# Patient Record
Sex: Female | Born: 1975
Health system: Southern US, Community
[De-identification: ages and names within clinical notes are randomized; demographics above are authoritative.]

## PROBLEM LIST (undated history)

## (undated) DIAGNOSIS — I10 Essential (primary) hypertension: Secondary | ICD-10-CM

## (undated) DIAGNOSIS — M1990 Unspecified osteoarthritis, unspecified site: Secondary | ICD-10-CM

## (undated) DIAGNOSIS — B20 Human immunodeficiency virus [HIV] disease: Secondary | ICD-10-CM

## (undated) HISTORY — PX: ECTOPIC PREGNANCY SURGERY: SHX613

## (undated) HISTORY — PX: OTHER SURGICAL HISTORY: SHX169

## (undated) MED FILL — Abacavir-Dolutegravir-Lamivudine Tab 600-50-300 MG: ORAL | Fill #0 | Status: CN

---

## 1999-12-27 HISTORY — PX: ECTOPIC PREGNANCY SURGERY: SHX613

## 2001-12-26 DIAGNOSIS — K429 Umbilical hernia without obstruction or gangrene: Secondary | ICD-10-CM

## 2001-12-26 DIAGNOSIS — I10 Essential (primary) hypertension: Secondary | ICD-10-CM

## 2001-12-26 DIAGNOSIS — B20 Human immunodeficiency virus [HIV] disease: Secondary | ICD-10-CM

## 2001-12-26 DIAGNOSIS — Z21 Asymptomatic human immunodeficiency virus [HIV] infection status: Secondary | ICD-10-CM

## 2001-12-26 HISTORY — DX: Asymptomatic human immunodeficiency virus (hiv) infection status: Z21

## 2001-12-26 HISTORY — DX: Human immunodeficiency virus (HIV) disease: B20

## 2001-12-26 HISTORY — DX: Umbilical hernia without obstruction or gangrene: K42.9

## 2001-12-26 HISTORY — PX: OTHER SURGICAL HISTORY: SHX169

## 2001-12-26 HISTORY — DX: Essential (primary) hypertension: I10

## 2012-09-19 DIAGNOSIS — E559 Vitamin D deficiency, unspecified: Secondary | ICD-10-CM | POA: Insufficient documentation

## 2012-09-19 DIAGNOSIS — I10 Essential (primary) hypertension: Secondary | ICD-10-CM | POA: Insufficient documentation

## 2013-07-15 ENCOUNTER — Emergency Department (HOSPITAL_COMMUNITY)
Admission: EM | Admit: 2013-07-15 | Discharge: 2013-07-15 | Disposition: A | Discharge status: Home / Self Care | Payer: 59 | Attending: Emergency Medicine | Admitting: Emergency Medicine

## 2013-07-15 ENCOUNTER — Emergency Department (HOSPITAL_COMMUNITY): Payer: 59

## 2013-07-15 ENCOUNTER — Encounter (HOSPITAL_COMMUNITY): Payer: Self-pay | Admitting: Emergency Medicine

## 2013-07-15 DIAGNOSIS — Z79899 Other long term (current) drug therapy: Secondary | ICD-10-CM | POA: Insufficient documentation

## 2013-07-15 DIAGNOSIS — I498 Other specified cardiac arrhythmias: Secondary | ICD-10-CM | POA: Insufficient documentation

## 2013-07-15 DIAGNOSIS — I471 Supraventricular tachycardia: Secondary | ICD-10-CM

## 2013-07-15 HISTORY — DX: Essential (primary) hypertension: I10

## 2013-07-15 LAB — COMPREHENSIVE METABOLIC PANEL
Albumin: 3.8 g/dL (ref 3.5–5.2)
Alkaline Phosphatase: 64 U/L (ref 39–117)
BUN: 10 mg/dL (ref 6–23)
Creatinine, Ser: 0.84 mg/dL (ref 0.50–1.10)
GFR calc Af Amer: >90 mL/min (ref >90)
Glucose, Bld: 71 mg/dL (ref 70–99)
Total Protein: 7.2 g/dL (ref 6.0–8.3)

## 2013-07-15 LAB — CBC WITH DIFFERENTIAL/PLATELET
Basophils Relative: 0 % (ref 0–1)
Eosinophils Absolute: 0 10*3/uL (ref 0.0–0.7)
Eosinophils Relative: 0 % (ref 0–5)
HCT: 44.6 % (ref 36.0–46.0)
Hemoglobin: 15.4 g/dL — ABNORMAL HIGH (ref 12.0–15.0)
Lymphs Abs: 3.6 10*3/uL (ref 0.7–4.0)
MCH: 32.4 pg (ref 26.0–34.0)
MCHC: 34.5 g/dL (ref 30.0–36.0)
MCV: 93.7 fL (ref 78.0–100.0)
Monocytes Absolute: 0.4 10*3/uL (ref 0.1–1.0)
Monocytes Relative: 5 % (ref 3–12)
Neutrophils Relative %: 46 % (ref 43–77)
RBC: 4.76 MIL/uL (ref 3.87–5.11)

## 2013-07-15 LAB — T4: T4, Total: 7.5 ug/dL (ref 5.0–12.5)

## 2013-07-15 LAB — TSH: TSH: 0.672 u[IU]/mL (ref 0.350–4.500)

## 2013-07-15 MED ORDER — ADENOSINE 6 MG/2ML IV SOLN
INTRAVENOUS | Status: AC
  Administered 2013-07-15: 6 mg
  Filled 2013-07-15: qty 4

## 2013-07-15 MED ORDER — SODIUM CHLORIDE 0.9 % IV BOLUS (SEPSIS)
1000.0000 mL | Freq: Once | INTRAVENOUS | Status: AC
  Administered 2013-07-15: 1000 mL via INTRAVENOUS

## 2013-07-15 MED ORDER — SODIUM CHLORIDE 0.9 % IV SOLN: INTRAVENOUS | Status: DC

## 2013-07-15 NOTE — ED Provider Notes (Signed)
History    CSN: [id number] Arrival date & time 07/15/13  1005  First MD Initiated Contact with Patient 07/15/13 1006     No chief complaint on file.  (Consider location/radiation/quality/duration/timing/severity/associated sxs/prior Treatment) The history is provided by the patient.   patient here complaining of sudden onset of palpitations about 4 hours prior to arrival that started after she ran up a hill. Denies any chest pain chest pressure. Was not short of breath. No recent leg pain or swelling. Has had a history of tachycardia in the past has never been evaluated. Does have a history of high blood pressure and notes compliance with her medications. She takes lisinopril, clonidine, diltiazem. No recent illnesses noted. Was at work and an EKG was done and patient was found to be in SVT with a rate of 160 and transported here. No treatment used prior to arrival No past medical history on file. No past surgical history on file. No family history on file. History  Substance Use Topics  . Smoking status: Not on file  . Smokeless tobacco: Not on file  . Alcohol Use: Not on file   OB History   No data available     Review of Systems  All other systems reviewed and are negative.    Allergies  Review of patient's allergies indicates not on file.  Home Medications  No current outpatient prescriptions on file. There were no vitals taken for this visit. Physical Exam  Nursing note and vitals reviewed. Constitutional: She is oriented to person, place, and time. She appears well-developed and well-nourished.  Non-toxic appearance. No distress.  HENT:  Head: Normocephalic and atraumatic.  Eyes: Conjunctivae, EOM and lids are normal. Pupils are equal, round, and reactive to light.  Neck: Normal range of motion. Neck supple. No tracheal deviation present. No mass present.  Cardiovascular: Regular rhythm and normal heart sounds.  Tachycardia present.  Exam reveals no gallop.   No  murmur heard. Pulmonary/Chest: Effort normal and breath sounds normal. No stridor. No respiratory distress. She has no decreased breath sounds. She has no wheezes. She has no rhonchi. She has no rales.  Abdominal: Soft. Normal appearance and bowel sounds are normal. She exhibits no distension. There is no tenderness. There is no rebound and no CVA tenderness.  Musculoskeletal: Normal range of motion. She exhibits no edema and no tenderness.  Neurological: She is alert and oriented to person, place, and time. She has normal strength. No cranial nerve deficit or sensory deficit. GCS eye subscore is 4. GCS verbal subscore is 5. GCS motor subscore is 6.  Skin: Skin is warm and dry. No abrasion and no rash noted.  Psychiatric: She has a normal mood and affect. Her speech is normal and behavior is normal.    ED Course  Procedures (including critical care time) Labs Reviewed  CBC WITH DIFFERENTIAL  COMPREHENSIVE METABOLIC PANEL  T4  TSH   No results found. No diagnosis found.  MDM   Date: 07/15/2013  Rate: 162  Rhythm: supraventricular tachycardia (SVT)  QRS Axis: normal  Intervals: normal  ST/T Wave abnormalities: nonspecific ST changes  Conduction Disutrbances:none  Narrative Interpretation:   Old EKG Reviewed: none available  11:36 AM Patient given 6 mg of adenosine for her heart rate of 170 without defect. A second dose of 12 mg was given and heart rate slightly decreased. She was given IV fluids as well. Given 5 mg of Cardizem IV and heart rate decreased gradually down to 105. She has  been a symptomatically this her blood pressures remained stable. She has been rechecked multiple times remains stable. She'll be discharged home and follow with her Dr. Thyroid function studies have been performed and patient was informed to have her Dr. followup on this.   CRITICAL CARE Performed by: Toy Baker Total critical care time: 50 Critical care time was exclusive of separately billable  procedures and treating other patients. Critical care was necessary to treat or prevent imminent or life-threatening deterioration. Critical care was time spent personally by me on the following activities: development of treatment plan with patient and/or surrogate as well as nursing, discussions with consultants, evaluation of patient's response to treatment, examination of patient, obtaining history from patient or surrogate, ordering and performing treatments and interventions, ordering and review of laboratory studies, ordering and review of radiographic studies, pulse oximetry and re-evaluation of patient's condition.'   Toy Baker, MD 07/15/13 1137

## 2013-07-15 NOTE — ED Notes (Addendum)
Pt came in with SVT, hr 160s. MD informed. 6mg  adenosine given with no response.  12mg  adenosine given, no immediate response but HR started to gradually decrease from 160s down to 130s. 5mg  cardizem given at 1021

## 2013-07-16 ENCOUNTER — Telehealth (HOSPITAL_COMMUNITY): Payer: Self-pay | Admitting: Emergency Medicine

## 2013-07-16 NOTE — ED Notes (Signed)
Pt calling for TSH results.  Pt informed Results WNL.

## 2015-07-30 DIAGNOSIS — B20 Human immunodeficiency virus [HIV] disease: Secondary | ICD-10-CM | POA: Insufficient documentation

## 2015-08-13 DIAGNOSIS — E669 Obesity, unspecified: Secondary | ICD-10-CM | POA: Insufficient documentation

## 2016-01-15 MED FILL — ATRIPLA 600-200-300 MG TABS: 600-200-300 | 90 days supply | Qty: 90 | Fill #2

## 2016-01-20 MED FILL — CARTIA XT 240 MG CAPSULE SA: 240 | 30 days supply | Qty: 30 | Fill #2

## 2016-02-11 MED FILL — CARTIA XT 240 MG CAPSULE SA: 240 | 30 days supply | Qty: 30 | Fill #3

## 2016-02-15 DIAGNOSIS — E559 Vitamin D deficiency, unspecified: Secondary | ICD-10-CM | POA: Diagnosis not present

## 2016-02-15 DIAGNOSIS — R0789 Other chest pain: Secondary | ICD-10-CM | POA: Diagnosis not present

## 2016-02-15 DIAGNOSIS — I10 Essential (primary) hypertension: Secondary | ICD-10-CM | POA: Diagnosis not present

## 2016-02-15 DIAGNOSIS — Z21 Asymptomatic human immunodeficiency virus [HIV] infection status: Secondary | ICD-10-CM | POA: Diagnosis not present

## 2016-02-15 DIAGNOSIS — H938X2 Other specified disorders of left ear: Secondary | ICD-10-CM | POA: Diagnosis not present

## 2016-02-15 MED FILL — NEO/POLYMYXIN/HC EAR SOLN: 3.5-10000-1 | 22 days supply | Qty: 10 | Fill #0

## 2016-03-07 DIAGNOSIS — I10 Essential (primary) hypertension: Secondary | ICD-10-CM | POA: Diagnosis not present

## 2016-03-07 DIAGNOSIS — M549 Dorsalgia, unspecified: Secondary | ICD-10-CM | POA: Diagnosis not present

## 2016-03-07 DIAGNOSIS — M791 Myalgia: Secondary | ICD-10-CM | POA: Diagnosis not present

## 2016-03-07 DIAGNOSIS — R55 Syncope and collapse: Secondary | ICD-10-CM | POA: Diagnosis not present

## 2016-03-07 DIAGNOSIS — B20 Human immunodeficiency virus [HIV] disease: Secondary | ICD-10-CM | POA: Diagnosis not present

## 2016-03-07 DIAGNOSIS — R5383 Other fatigue: Secondary | ICD-10-CM | POA: Diagnosis not present

## 2016-03-07 DIAGNOSIS — R079 Chest pain, unspecified: Secondary | ICD-10-CM | POA: Diagnosis not present

## 2016-03-07 DIAGNOSIS — G8929 Other chronic pain: Secondary | ICD-10-CM | POA: Diagnosis not present

## 2016-03-08 DIAGNOSIS — R079 Chest pain, unspecified: Secondary | ICD-10-CM | POA: Diagnosis not present

## 2016-03-08 DIAGNOSIS — B20 Human immunodeficiency virus [HIV] disease: Secondary | ICD-10-CM | POA: Diagnosis not present

## 2016-03-08 DIAGNOSIS — R5383 Other fatigue: Secondary | ICD-10-CM | POA: Diagnosis not present

## 2016-03-08 DIAGNOSIS — M549 Dorsalgia, unspecified: Secondary | ICD-10-CM | POA: Diagnosis not present

## 2016-03-08 DIAGNOSIS — G8929 Other chronic pain: Secondary | ICD-10-CM | POA: Diagnosis not present

## 2016-03-08 DIAGNOSIS — R55 Syncope and collapse: Secondary | ICD-10-CM | POA: Diagnosis not present

## 2016-03-08 DIAGNOSIS — M791 Myalgia: Secondary | ICD-10-CM | POA: Diagnosis not present

## 2016-03-08 DIAGNOSIS — I10 Essential (primary) hypertension: Secondary | ICD-10-CM | POA: Diagnosis not present

## 2016-03-25 MED FILL — LISINOPRIL-HCTZ 10-12.5 MG: 10-12.5 | 90 days supply | Qty: 90 | Fill #1

## 2016-03-25 MED FILL — CARTIA XT 240 MG CAPSULE: 240 | 30 days supply | Qty: 30 | Fill #4

## 2016-04-29 MED FILL — CARTIA XT 240 MG CAPSULE: 240 | 30 days supply | Qty: 30 | Fill #5

## 2016-04-29 MED FILL — cloNIDine HCL 0.1 MG TABS: 0.1 | 90 days supply | Qty: 90 | Fill #1

## 2016-05-03 DIAGNOSIS — I10 Essential (primary) hypertension: Secondary | ICD-10-CM | POA: Diagnosis not present

## 2016-05-03 DIAGNOSIS — B20 Human immunodeficiency virus [HIV] disease: Secondary | ICD-10-CM | POA: Diagnosis not present

## 2016-05-03 DIAGNOSIS — E669 Obesity, unspecified: Secondary | ICD-10-CM | POA: Diagnosis not present

## 2016-05-03 DIAGNOSIS — Z79899 Other long term (current) drug therapy: Secondary | ICD-10-CM | POA: Diagnosis not present

## 2016-06-01 MED FILL — CARTIA XT 240 MG CAPSULE: 240 | 30 days supply | Qty: 30 | Fill #0

## 2016-06-07 MED FILL — ATRIPLA 600-200-300 MG TABS: 600-200-300 | 30 days supply | Qty: 30 | Fill #0

## 2016-06-16 ENCOUNTER — Telehealth: Payer: Self-pay | Admitting: Pharmacist

## 2016-06-16 NOTE — Telephone Encounter (Signed)
Called patient to schedule appointment with the [facility]. Unable to reach patient but left a HIPAA-compliant message requesting that she return my call.

## 2016-07-08 MED FILL — CARTIA XT 240 MG CAPSULE: 240 | 30 days supply | Qty: 30 | Fill #1

## 2016-07-13 MED FILL — LISINOPRIL-HCTZ 10-12.5 MG: 10-12.5 | 90 days supply | Qty: 90 | Fill #0 | Status: TO

## 2016-08-10 MED FILL — cloNIDine HCL 0.1 MG TABS: 0.1 | 30 days supply | Qty: 30 | Fill #0

## 2016-08-10 MED FILL — CARTIA XT 240 MG CAPSULE: 240 | 30 days supply | Qty: 30 | Fill #2 | Status: TO

## 2016-08-15 DIAGNOSIS — Z0000 Encounter for general adult medical examination without abnormal findings: Secondary | ICD-10-CM | POA: Diagnosis not present

## 2016-08-15 DIAGNOSIS — E669 Obesity, unspecified: Secondary | ICD-10-CM | POA: Diagnosis not present

## 2016-08-15 DIAGNOSIS — Z21 Asymptomatic human immunodeficiency virus [HIV] infection status: Secondary | ICD-10-CM | POA: Diagnosis not present

## 2016-08-15 DIAGNOSIS — I10 Essential (primary) hypertension: Secondary | ICD-10-CM | POA: Diagnosis not present

## 2016-09-13 MED FILL — cloNIDine HCL 0.1 MG TABS: 0.1 | 30 days supply | Qty: 30 | Fill #0

## 2016-09-13 MED FILL — CARTIA XT 240 MG CAPSULE SA: 240 | 30 days supply | Qty: 30 | Fill #0

## 2016-10-19 MED FILL — cloNIDine HCL 0.1 MG TABS: 0.1 | 30 days supply | Qty: 30 | Fill #1

## 2016-10-25 MED FILL — CARTIA XT 240 MG CAPSULE SA: 240 | 30 days supply | Qty: 30 | Fill #1

## 2016-10-27 MED FILL — LISINOPRIL-HCTZ 10-12.5 MG: 10-12.5 | 90 days supply | Qty: 90 | Fill #0

## 2016-11-01 DIAGNOSIS — Z23 Encounter for immunization: Secondary | ICD-10-CM | POA: Diagnosis not present

## 2016-11-01 DIAGNOSIS — I10 Essential (primary) hypertension: Secondary | ICD-10-CM | POA: Diagnosis not present

## 2016-11-01 DIAGNOSIS — Z21 Asymptomatic human immunodeficiency virus [HIV] infection status: Secondary | ICD-10-CM | POA: Diagnosis not present

## 2016-11-01 DIAGNOSIS — B20 Human immunodeficiency virus [HIV] disease: Secondary | ICD-10-CM | POA: Diagnosis not present

## 2016-11-02 DIAGNOSIS — B20 Human immunodeficiency virus [HIV] disease: Secondary | ICD-10-CM | POA: Diagnosis not present

## 2016-11-04 MED FILL — TRIUMEQ 600-50-300 MG TABS: 600-50-300 | 30 days supply | Qty: 30 | Fill #0

## 2016-11-08 DIAGNOSIS — Z01419 Encounter for gynecological examination (general) (routine) without abnormal findings: Secondary | ICD-10-CM | POA: Diagnosis not present

## 2016-11-08 DIAGNOSIS — Z113 Encounter for screening for infections with a predominantly sexual mode of transmission: Secondary | ICD-10-CM | POA: Diagnosis not present

## 2016-11-08 DIAGNOSIS — Z124 Encounter for screening for malignant neoplasm of cervix: Secondary | ICD-10-CM | POA: Diagnosis not present

## 2016-11-23 MED FILL — cloNIDine HCL 0.1 MG TABS: 0.1 | 30 days supply | Qty: 30 | Fill #2

## 2016-11-29 MED FILL — CARTIA XT 240 MG CAPSULE SA: 240 | 30 days supply | Qty: 30 | Fill #2

## 2016-12-05 DIAGNOSIS — S4992XD Unspecified injury of left shoulder and upper arm, subsequent encounter: Secondary | ICD-10-CM | POA: Diagnosis not present

## 2016-12-05 DIAGNOSIS — M898X1 Other specified disorders of bone, shoulder: Secondary | ICD-10-CM | POA: Diagnosis not present

## 2016-12-05 DIAGNOSIS — Z79899 Other long term (current) drug therapy: Secondary | ICD-10-CM | POA: Diagnosis not present

## 2016-12-05 MED FILL — CYCLOBENZAPRINE 10 MG TAB: 10 | 10 days supply | Qty: 30 | Fill #0

## 2016-12-05 MED FILL — DICLOFENAC SOD 75 MG TAB EC: 75 | 10 days supply | Qty: 20 | Fill #0

## 2016-12-09 MED FILL — TRIUMEQ 600-50-300 MG TABS: 600-50-300 | 30 days supply | Qty: 30 | Fill #1

## 2016-12-13 DIAGNOSIS — M25512 Pain in left shoulder: Secondary | ICD-10-CM | POA: Diagnosis not present

## 2016-12-13 DIAGNOSIS — M898X1 Other specified disorders of bone, shoulder: Secondary | ICD-10-CM | POA: Diagnosis not present

## 2016-12-13 DIAGNOSIS — M19012 Primary osteoarthritis, left shoulder: Secondary | ICD-10-CM | POA: Diagnosis not present

## 2016-12-13 DIAGNOSIS — G8929 Other chronic pain: Secondary | ICD-10-CM | POA: Diagnosis not present

## 2016-12-13 DIAGNOSIS — M542 Cervicalgia: Secondary | ICD-10-CM | POA: Diagnosis not present

## 2016-12-13 DIAGNOSIS — M47812 Spondylosis without myelopathy or radiculopathy, cervical region: Secondary | ICD-10-CM | POA: Diagnosis not present

## 2016-12-22 MED FILL — DICLOFENAC SOD 75 MG TAB EC: 75 | 10 days supply | Qty: 20 | Fill #1

## 2017-01-02 MED FILL — cloNIDine HCL 0.1 MG TABS: 0.1 | 30 days supply | Qty: 30 | Fill #3

## 2017-01-02 MED FILL — CARTIA XT 240 MG CAPSULE SA: 240 | 30 days supply | Qty: 30 | Fill #0

## 2017-01-11 MED FILL — TRIUMEQ 600-50-300 MG TABS: 600-50-300 | 30 days supply | Qty: 30 | Fill #2

## 2017-02-01 DIAGNOSIS — M791 Myalgia: Secondary | ICD-10-CM | POA: Diagnosis not present

## 2017-02-01 DIAGNOSIS — G8929 Other chronic pain: Secondary | ICD-10-CM | POA: Insufficient documentation

## 2017-02-01 DIAGNOSIS — M792 Neuralgia and neuritis, unspecified: Secondary | ICD-10-CM | POA: Insufficient documentation

## 2017-02-01 DIAGNOSIS — M898X1 Other specified disorders of bone, shoulder: Secondary | ICD-10-CM | POA: Diagnosis not present

## 2017-02-02 MED FILL — cloNIDine HCL 0.1 MG TABS: 0.1 | 30 days supply | Qty: 30 | Fill #4

## 2017-02-02 MED FILL — CARTIA XT 240 MG CAPSULE SA: 240 | 30 days supply | Qty: 30 | Fill #1

## 2017-02-03 MED FILL — TRIUMEQ 600-50-300 MG TABS: 600-50-300 | 30 days supply | Qty: 30 | Fill #3

## 2017-02-06 MED FILL — LISINOPRIL-HCTZ 10-12.5 MG: 10-12.5 | 30 days supply | Qty: 30 | Fill #0

## 2017-02-23 MED FILL — CYCLOBENZAPRINE 10 MG TAB: 10 | 10 days supply | Qty: 30 | Fill #0

## 2017-02-24 DIAGNOSIS — E559 Vitamin D deficiency, unspecified: Secondary | ICD-10-CM | POA: Diagnosis not present

## 2017-02-24 DIAGNOSIS — B20 Human immunodeficiency virus [HIV] disease: Secondary | ICD-10-CM | POA: Diagnosis not present

## 2017-02-24 DIAGNOSIS — I10 Essential (primary) hypertension: Secondary | ICD-10-CM | POA: Diagnosis not present

## 2017-02-24 DIAGNOSIS — E669 Obesity, unspecified: Secondary | ICD-10-CM | POA: Diagnosis not present

## 2017-02-24 DIAGNOSIS — R5383 Other fatigue: Secondary | ICD-10-CM | POA: Diagnosis not present

## 2017-03-13 MED FILL — CARTIA XT 240 MG CAPSULE SA: 240 | 30 days supply | Qty: 30 | Fill #2

## 2017-03-13 MED FILL — LISINOPRIL-HCTZ 10-12.5 MG: 10-12.5 | 90 days supply | Qty: 90 | Fill #0

## 2017-03-13 MED FILL — TRIUMEQ 600-50-300 MG TABS: 600-50-300 | 30 days supply | Qty: 30 | Fill #4

## 2017-03-16 MED FILL — cloNIDine HCL 0.1 MG TABS: 0.1 | 30 days supply | Qty: 30 | Fill #0

## 2017-03-21 DIAGNOSIS — M792 Neuralgia and neuritis, unspecified: Secondary | ICD-10-CM | POA: Diagnosis not present

## 2017-03-21 DIAGNOSIS — M19012 Primary osteoarthritis, left shoulder: Secondary | ICD-10-CM | POA: Diagnosis not present

## 2017-03-21 DIAGNOSIS — G8929 Other chronic pain: Secondary | ICD-10-CM | POA: Diagnosis not present

## 2017-03-21 DIAGNOSIS — M791 Myalgia: Secondary | ICD-10-CM | POA: Diagnosis not present

## 2017-04-07 MED FILL — TRIUMEQ 600-50-300 MG TABS: 600-50-300 | 30 days supply | Qty: 30 | Fill #5

## 2017-04-24 MED FILL — CARTIA XT 240 MG CAPSULE SA: 240 | 30 days supply | Qty: 30 | Fill #3

## 2017-04-28 MED FILL — cloNIDine HCL 0.1 MG TABS: 0.1 | 30 days supply | Qty: 30 | Fill #1

## 2017-05-19 MED FILL — TRIUMEQ 600-50-300 MG TABS: 600-50-300 | 30 days supply | Qty: 30 | Fill #0

## 2017-05-29 MED FILL — CARTIA XT 240 MG CAPSULE SA: 240 | 30 days supply | Qty: 30 | Fill #4

## 2017-06-14 MED FILL — cloNIDine HCL 0.1 MG TABS: 0.1 | 30 days supply | Qty: 30 | Fill #2

## 2017-06-19 MED FILL — TRIUMEQ 600-50-300 MG TABS: 600-50-300 | 30 days supply | Qty: 30 | Fill #1

## 2017-07-03 MED FILL — CARTIA XT 240 MG CAPSULE SA: 240 | 30 days supply | Qty: 30 | Fill #5

## 2017-07-03 MED FILL — LISINOPRIL-HCTZ 10-12.5 MG: 10-12.5 | 90 days supply | Qty: 90 | Fill #1

## 2017-07-04 DIAGNOSIS — B20 Human immunodeficiency virus [HIV] disease: Secondary | ICD-10-CM | POA: Diagnosis not present

## 2017-07-04 DIAGNOSIS — I10 Essential (primary) hypertension: Secondary | ICD-10-CM | POA: Diagnosis not present

## 2017-07-17 MED FILL — cloNIDine HCL 0.1 MG TABS: 0.1 | 30 days supply | Qty: 30 | Fill #3

## 2017-07-25 MED FILL — TRIUMEQ 600-50-300 MG TABS: 600-50-300 | 30 days supply | Qty: 30 | Fill #2

## 2017-08-04 MED FILL — CARTIA XT 240 MG CAPSULE SA: 240 | 30 days supply | Qty: 30 | Fill #0

## 2017-08-16 DIAGNOSIS — G8929 Other chronic pain: Secondary | ICD-10-CM | POA: Diagnosis not present

## 2017-08-16 DIAGNOSIS — M898X1 Other specified disorders of bone, shoulder: Secondary | ICD-10-CM | POA: Diagnosis not present

## 2017-08-16 MED FILL — MELOXICAM 15 MG TABLET: 15 | 30 days supply | Qty: 30 | Fill #0

## 2017-08-17 MED FILL — cloNIDine HCL 0.1 MG TABS: 0.1 | 30 days supply | Qty: 30 | Fill #4

## 2017-08-22 DIAGNOSIS — R0789 Other chest pain: Secondary | ICD-10-CM | POA: Diagnosis not present

## 2017-08-22 DIAGNOSIS — M898X1 Other specified disorders of bone, shoulder: Secondary | ICD-10-CM | POA: Diagnosis not present

## 2017-08-22 DIAGNOSIS — G8929 Other chronic pain: Secondary | ICD-10-CM | POA: Diagnosis not present

## 2017-08-22 MED FILL — TRIUMEQ 600-50-300 MG TABS: 600-50-300 | 30 days supply | Qty: 30 | Fill #3

## 2017-09-02 DIAGNOSIS — Z1231 Encounter for screening mammogram for malignant neoplasm of breast: Secondary | ICD-10-CM | POA: Diagnosis not present

## 2017-09-05 DIAGNOSIS — R928 Other abnormal and inconclusive findings on diagnostic imaging of breast: Secondary | ICD-10-CM | POA: Diagnosis not present

## 2017-09-05 DIAGNOSIS — N6489 Other specified disorders of breast: Secondary | ICD-10-CM | POA: Diagnosis not present

## 2017-09-06 DIAGNOSIS — G8929 Other chronic pain: Secondary | ICD-10-CM | POA: Diagnosis not present

## 2017-09-06 DIAGNOSIS — M25512 Pain in left shoulder: Secondary | ICD-10-CM | POA: Diagnosis not present

## 2017-09-07 MED FILL — CARTIA XT 240 MG CAPSULE SA: 240 | 30 days supply | Qty: 30 | Fill #1

## 2017-09-21 MED FILL — TRIUMEQ 600-50-300 MG TABS: 600-50-300 | 30 days supply | Qty: 30 | Fill #4

## 2017-09-25 MED FILL — MELOXICAM 15 MG TABLET: 15 | 30 days supply | Qty: 30 | Fill #1

## 2017-09-25 MED FILL — cloNIDine HCL 0.1 MG TABS: 0.1 | 30 days supply | Qty: 30 | Fill #5

## 2017-09-26 DIAGNOSIS — G8929 Other chronic pain: Secondary | ICD-10-CM | POA: Diagnosis not present

## 2017-09-26 DIAGNOSIS — M25512 Pain in left shoulder: Secondary | ICD-10-CM | POA: Diagnosis not present

## 2017-09-26 DIAGNOSIS — R202 Paresthesia of skin: Secondary | ICD-10-CM | POA: Diagnosis not present

## 2017-10-13 MED FILL — CARTIA XT 240 MG CAPSULE SA: 240 | 30 days supply | Qty: 30 | Fill #2

## 2017-10-16 MED FILL — LISINOPRIL-HCTZ 10-12.5 MG: 10-12.5 | 90 days supply | Qty: 90 | Fill #0

## 2017-10-19 MED FILL — cloNIDine HCL 0.1 MG TABS: 0.1 | 30 days supply | Qty: 30 | Fill #0

## 2017-11-01 MED FILL — TRIUMEQ 600-50-300 MG TABS: 600-50-300 | 30 days supply | Qty: 30 | Fill #5

## 2017-11-06 DIAGNOSIS — M898X1 Other specified disorders of bone, shoulder: Secondary | ICD-10-CM | POA: Diagnosis not present

## 2017-11-09 ENCOUNTER — Other Ambulatory Visit (HOSPITAL_BASED_OUTPATIENT_CLINIC_OR_DEPARTMENT_OTHER): Payer: Self-pay | Admitting: Student

## 2017-11-09 DIAGNOSIS — M898X1 Other specified disorders of bone, shoulder: Secondary | ICD-10-CM

## 2017-11-11 ENCOUNTER — Encounter (HOSPITAL_BASED_OUTPATIENT_CLINIC_OR_DEPARTMENT_OTHER)
Admission: RE | Admit: 2017-11-11 | Discharge: 2017-11-11 | Disposition: A | Discharge status: Home / Self Care | Payer: 59 | Source: Ambulatory Visit | Attending: Student | Admitting: Student

## 2017-11-11 DIAGNOSIS — M898X1 Other specified disorders of bone, shoulder: Secondary | ICD-10-CM | POA: Insufficient documentation

## 2017-11-12 DIAGNOSIS — M25512 Pain in left shoulder: Secondary | ICD-10-CM | POA: Diagnosis not present

## 2017-11-13 MED ORDER — GADOBENATE DIMEGLUMINE 529 MG/ML IV SOLN: 15.0000 mL | Freq: Once | INTRAVENOUS | Status: AC | PRN

## 2017-11-13 MED ORDER — GADOBENATE DIMEGLUMINE 529 MG/ML IV SOLN: 15.0000 mL | Freq: Once | INTRAVENOUS | Status: DC | PRN

## 2017-11-20 MED FILL — CARTIA XT 240 MG CAPSULE SA: 240 | 30 days supply | Qty: 30 | Fill #3

## 2017-12-01 MED FILL — TRIUMEQ 600-50-300 MG TABS: 600-50-300 | 30 days supply | Qty: 30 | Fill #0

## 2017-12-01 MED FILL — cloNIDine HCL 0.1 MG TABS: 0.1 | 30 days supply | Qty: 30 | Fill #1

## 2017-12-06 DIAGNOSIS — Z124 Encounter for screening for malignant neoplasm of cervix: Secondary | ICD-10-CM | POA: Diagnosis not present

## 2017-12-06 DIAGNOSIS — Z01419 Encounter for gynecological examination (general) (routine) without abnormal findings: Secondary | ICD-10-CM | POA: Diagnosis not present

## 2018-01-01 MED FILL — TRIUMEQ 600-50-300 MG TABS: 600-50-300 | 30 days supply | Qty: 30 | Fill #1

## 2018-01-01 MED FILL — cloNIDine HCL 0.1 MG TABS: 0.1 | 30 days supply | Qty: 30 | Fill #2

## 2018-01-01 MED FILL — CARTIA XT 240 MG CAPSULE SA: 240 | 30 days supply | Qty: 30 | Fill #4

## 2018-01-14 DIAGNOSIS — R5383 Other fatigue: Secondary | ICD-10-CM | POA: Diagnosis not present

## 2018-01-14 DIAGNOSIS — Z3202 Encounter for pregnancy test, result negative: Secondary | ICD-10-CM | POA: Diagnosis not present

## 2018-01-14 DIAGNOSIS — R0789 Other chest pain: Secondary | ICD-10-CM | POA: Diagnosis not present

## 2018-01-16 DIAGNOSIS — G8929 Other chronic pain: Secondary | ICD-10-CM | POA: Diagnosis not present

## 2018-01-16 DIAGNOSIS — M25512 Pain in left shoulder: Secondary | ICD-10-CM | POA: Diagnosis not present

## 2018-01-30 DIAGNOSIS — G8929 Other chronic pain: Secondary | ICD-10-CM | POA: Diagnosis not present

## 2018-01-30 DIAGNOSIS — M25512 Pain in left shoulder: Secondary | ICD-10-CM | POA: Diagnosis not present

## 2018-02-05 MED FILL — TRIUMEQ 600-50-300 MG TABS: 600-50-300 | 30 days supply | Qty: 30 | Fill #2

## 2018-02-05 MED FILL — cloNIDine HCL 0.1 MG TABS: 0.1 | 30 days supply | Qty: 30 | Fill #0

## 2018-02-06 DIAGNOSIS — Z9181 History of falling: Secondary | ICD-10-CM | POA: Diagnosis not present

## 2018-02-06 DIAGNOSIS — M25512 Pain in left shoulder: Secondary | ICD-10-CM | POA: Diagnosis not present

## 2018-02-06 DIAGNOSIS — B20 Human immunodeficiency virus [HIV] disease: Secondary | ICD-10-CM | POA: Diagnosis not present

## 2018-02-06 DIAGNOSIS — R29898 Other symptoms and signs involving the musculoskeletal system: Secondary | ICD-10-CM | POA: Diagnosis not present

## 2018-02-06 DIAGNOSIS — M62838 Other muscle spasm: Secondary | ICD-10-CM | POA: Diagnosis not present

## 2018-02-19 MED FILL — CARTIA XT 240 MG CAPSULE SA: 240 | 30 days supply | Qty: 30 | Fill #5

## 2018-02-27 DIAGNOSIS — M6281 Muscle weakness (generalized): Secondary | ICD-10-CM | POA: Diagnosis not present

## 2018-02-27 DIAGNOSIS — M25512 Pain in left shoulder: Secondary | ICD-10-CM | POA: Diagnosis not present

## 2018-03-05 MED FILL — LISINOPRIL-HCTZ 10-12.5 MG: 10-12.5 | 7 days supply | Qty: 7 | Fill #0

## 2018-03-06 DIAGNOSIS — M25512 Pain in left shoulder: Secondary | ICD-10-CM | POA: Diagnosis not present

## 2018-03-06 DIAGNOSIS — M6281 Muscle weakness (generalized): Secondary | ICD-10-CM | POA: Diagnosis not present

## 2018-03-13 DIAGNOSIS — M25512 Pain in left shoulder: Secondary | ICD-10-CM | POA: Diagnosis not present

## 2018-03-13 DIAGNOSIS — M6281 Muscle weakness (generalized): Secondary | ICD-10-CM | POA: Diagnosis not present

## 2018-03-14 MED FILL — TRIUMEQ 600-50-300 MG TABS: 600-50-300 | 30 days supply | Qty: 30 | Fill #3

## 2018-03-20 DIAGNOSIS — M25512 Pain in left shoulder: Secondary | ICD-10-CM | POA: Diagnosis not present

## 2018-03-20 DIAGNOSIS — M6281 Muscle weakness (generalized): Secondary | ICD-10-CM | POA: Diagnosis not present

## 2018-04-02 DIAGNOSIS — R7989 Other specified abnormal findings of blood chemistry: Secondary | ICD-10-CM | POA: Diagnosis not present

## 2018-04-02 DIAGNOSIS — B20 Human immunodeficiency virus [HIV] disease: Secondary | ICD-10-CM | POA: Diagnosis not present

## 2018-04-02 DIAGNOSIS — R110 Nausea: Secondary | ICD-10-CM | POA: Diagnosis not present

## 2018-04-02 DIAGNOSIS — R5383 Other fatigue: Secondary | ICD-10-CM | POA: Diagnosis not present

## 2018-04-02 DIAGNOSIS — R740 Nonspecific elevation of levels of transaminase and lactic acid dehydrogenase [LDH]: Secondary | ICD-10-CM | POA: Diagnosis not present

## 2018-04-02 DIAGNOSIS — Z79899 Other long term (current) drug therapy: Secondary | ICD-10-CM | POA: Diagnosis not present

## 2018-04-05 DIAGNOSIS — B20 Human immunodeficiency virus [HIV] disease: Secondary | ICD-10-CM | POA: Diagnosis not present

## 2018-04-05 DIAGNOSIS — I10 Essential (primary) hypertension: Secondary | ICD-10-CM | POA: Diagnosis not present

## 2018-04-05 DIAGNOSIS — E669 Obesity, unspecified: Secondary | ICD-10-CM | POA: Diagnosis not present

## 2018-04-05 MED FILL — CARTIA XT 240 MG CAPSULE SA: 240 | 30 days supply | Qty: 30 | Fill #0

## 2018-04-05 MED FILL — LISINOPRIL-HCTZ 10-12.5 MG: 10-12.5 | 90 days supply | Qty: 90 | Fill #0

## 2018-04-05 MED FILL — cloNIDine HCL 0.1 MG TABS: 0.1 | 30 days supply | Qty: 30 | Fill #0

## 2018-04-18 MED FILL — TRIUMEQ 600-50-300 MG TABS: 600-50-300 | 30 days supply | Qty: 30 | Fill #4

## 2018-05-09 MED FILL — LISINOPRIL-HCTZ 10-12.5 MG: 10-12.5 | 90 days supply | Qty: 90 | Fill #1

## 2018-05-11 MED FILL — CARTIA XT 240 MG CAPSULE SA: 240 | 30 days supply | Qty: 30 | Fill #1

## 2018-05-11 MED FILL — cloNIDine HCL 0.1 MG TABS: 0.1 | 30 days supply | Qty: 30 | Fill #1

## 2018-05-15 MED FILL — TRIUMEQ 600-50-300 MG TABS: 600-50-300 | 30 days supply | Qty: 30 | Fill #5

## 2018-06-05 MED FILL — cloNIDine HCL 0.1 MG TABS: 0.1 | 30 days supply | Qty: 30 | Fill #2

## 2018-06-11 MED FILL — TRIUMEQ 600-50-300 MG TABS: 600-50-300 | 30 days supply | Qty: 30 | Fill #0

## 2018-06-22 MED FILL — CARTIA XT 240 MG CAPSULE SA: 240 | 30 days supply | Qty: 30 | Fill #2

## 2018-07-11 MED FILL — TRIUMEQ 600-50-300 MG TABS: 600-50-300 | 30 days supply | Qty: 30 | Fill #1

## 2018-07-20 ENCOUNTER — Telehealth: Payer: Self-pay | Admitting: Pharmacist

## 2018-07-20 NOTE — Telephone Encounter (Signed)
Patient called and informed that video visits would be likely up and running in the new few weeks (her preference is video visit due to distance from my clinic). I will call her and set up time once the platform is available. Patient aware.

## 2018-07-30 MED FILL — CARTIA XT 240 MG CAPSULE SA: 240 | 30 days supply | Qty: 30 | Fill #3

## 2018-08-09 ENCOUNTER — Encounter: Payer: Self-pay | Admitting: Family Medicine

## 2018-08-09 ENCOUNTER — Ambulatory Visit (INDEPENDENT_AMBULATORY_CARE_PROVIDER_SITE_OTHER): Payer: 59 | Admitting: Family Medicine

## 2018-08-09 VITALS — BP 130/91 | HR 96 | Ht 61.0 in | Wt 170.0 lb

## 2018-08-09 DIAGNOSIS — M542 Cervicalgia: Secondary | ICD-10-CM | POA: Diagnosis not present

## 2018-08-09 DIAGNOSIS — M898X1 Other specified disorders of bone, shoulder: Secondary | ICD-10-CM | POA: Diagnosis not present

## 2018-08-09 DIAGNOSIS — G8929 Other chronic pain: Secondary | ICD-10-CM

## 2018-08-09 NOTE — Patient Instructions (Signed)
There's only a couple things that come to mind that haven't been ruled out:  Cervical radiculopathy (pinched nerve in cervical spine causing symptoms into scapula, under armpit), scapulothoracic bursitis (this would be unusual to have this distribution).

## 2018-08-10 ENCOUNTER — Ambulatory Visit (INDEPENDENT_AMBULATORY_CARE_PROVIDER_SITE_OTHER): Payer: 59 | Admitting: Pharmacist

## 2018-08-10 DIAGNOSIS — Z79899 Other long term (current) drug therapy: Secondary | ICD-10-CM

## 2018-08-10 MED ORDER — ABACAVIR-DOLUTEGRAVIR-LAMIVUD 600-50-300 MG PO TABS: 1.0000 | ORAL_TABLET | Freq: Every day | ORAL | 5 refills | Status: DC

## 2018-08-10 MED FILL — cloNIDine HCL 0.1 MG TABS: 0.1 | 30 days supply | Qty: 30 | Fill #3

## 2018-08-10 MED FILL — TRIUMEQ 600-50-300 MG TABS: 600-50-300 | 30 days supply | Qty: 30 | Fill #0

## 2018-08-10 NOTE — Progress Notes (Signed)
   S: Patient presents to [facility] for review of their specialty medication therapy.    Patient is currently taking Triumeq for HIV. Patient is managed by [facility] for this.   Adherence: denies any missed doses.  Efficacy: she is tolerating it well with no adverse effects.  Dosing: 1 tablet daily  Dosing: Renal Impairment: Adult  CrCl ?50 mL/minute: No dosage adjustment necessary. CrCl <50 mL/minute: Use is not recommended (use dose-adjusted individual component drugs).  Dosing: Hepatic Impairment: Adult  Mild impairment (Child-Pugh class A): Use is not recommended (use dose-adjusted individual component drugs). Moderate to severe impairment (Child-Pugh class B or C): Use is contraindicated.  Monitoring: HIV RNA: undetetable CD4 count: see [facility] HLA-B*5701 allele:  S/sx of pancreatitis: denies S/sx of coronary heart disease: denies S/sx of opportunistic infections: denies Neuropsychiatric s/sx: denies Skin reactions: denies Hepatotoxicity: denies. LFTs last drawn 03/2018, see [facility] Nephrotoxicity: denies, last SCr 0.77 S/sx of lactic acidosis: denies Hyperglycemia:  Denies (last A1c 5.4 on 04/02/18)  O:     Lab Results  Component Value Date   WBC 7.3 07/15/2013   HGB 15.4 (H) 07/15/2013   HCT 44.6 07/15/2013   MCV 93.7 07/15/2013   PLT 184 07/15/2013      Chemistry      Component Value Date/Time   NA 136 07/15/2013 1025   K 3.5 07/15/2013 1025   CL 103 07/15/2013 1025   CO2 25 07/15/2013 1025   BUN 10 07/15/2013 1025   CREATININE 0.84 07/15/2013 1025      Component Value Date/Time   CALCIUM 9.1 07/15/2013 1025   ALKPHOS 64 07/15/2013 1025   AST 21 07/15/2013 1025   ALT 16 07/15/2013 1025   BILITOT 0.3 07/15/2013 1025       No results found for: CD4TCELL, CD4TABS  No results found for: HIV1RNAQUANT   A/P: 1. Medication review: patient is on Triumeq for HIV. Reviewed the medication with the patient, including the  following: Triumeq is a combination medication used for the treatment of HIV. Patient educated on purpose, proper use and potential adverse effects of Triumeq. Possible adverse effects including metabolic disturbances, neuropsychiatric symptoms, and renal/hepatic dysfunction. Adherence is crucial when using this drug to avoid mutations and resistance. No recommendations for changes.    Christella Hartigan, PharmD, BCPS, BCACP, CPP Clinical Pharmacist Practitioner  [phone]

## 2018-08-13 ENCOUNTER — Encounter: Payer: Self-pay | Admitting: Family Medicine

## 2018-08-13 NOTE — Progress Notes (Signed)
PCP: Lowry Bowl, MD  Subjective:   HPI: Patient is a 42 y.o. female here for left posterior shoulder pain.  Patient reports about 7 years ago she tripped and fell directly onto her back and left shoulder causing left posterior shoulder pain around the scapula rating into the armpit. She is continued to have pain in this area at a 7 out of 10 level, sharp. Any movement of the left shoulder worsens the pain. She does get some tingling down the left arm and into all fingers. She is right-handed. He does tend to help this. She was seen by sports medicine in August and November of last year. Previously she tried physical therapy and this did help a lot. Meloxicam did seem to provide some benefit as well. She had radiographs of the thoracic and lumbar spine in 2012 showing mild degenerative changes of the thoracic spine. In September 2013 she had an MRI of her left shoulder and was found to have edema within the deltoid muscle but no other findings. In October 2014 she had an MRI of the thoracic spine that showed only mild degenerative changes at T11-06 December 2016 she had radiographs of her left shoulder, scapula, and cervical spine showing mild AC joint arthritis and mild degenerative changes at C5-6 and C6-7 with straightening of the cervical spine She had a suprascapular nerve block in March 2018 that did not help. She had a nerve conduction study that was reportedly normal though results not available in chart.  Past Medical History:  Diagnosis Date  . Hypertension     Current Outpatient Medications on File Prior to Visit  Medication Sig Dispense Refill  . CARTIA XT 240 MG 24 hr capsule   1  . cloNIDine (CATAPRES) 0.1 MG tablet Take 0.1 mg by mouth every evening.    . diltiazem (DILACOR XR) 240 MG 24 hr capsule Take 240 mg by mouth every morning.    Marland Kitchen lisinopril-hydrochlorothiazide (PRINZIDE,ZESTORETIC) 10-12.5 MG per tablet Take 1 tablet by mouth every morning.      Current Facility-Administered Medications on File Prior to Visit  Medication Dose Route Frequency Provider Last Rate Last Dose  . gadobenate dimeglumine (MULTIHANCE) injection 15 mL  15 mL Intravenous Once PRN System, Provider Not In        History reviewed. No pertinent surgical history.  No Known Allergies  Social History   Socioeconomic History  . Marital status: Single    Spouse name: Not on file  . Number of children: Not on file  . Years of education: Not on file  . Highest education level: Not on file  Occupational History  . Not on file  Social Needs  . Financial resource strain: Not on file  . Food insecurity:    Worry: Not on file    Inability: Not on file  . Transportation needs:    Medical: Not on file    Non-medical: Not on file  Tobacco Use  . Smoking status: Never Smoker  . Smokeless tobacco: Never Used  Substance and Sexual Activity  . Alcohol use: No  . Drug use: Not on file  . Sexual activity: Not on file  Lifestyle  . Physical activity:    Days per week: Not on file    Minutes per session: Not on file  . Stress: Not on file  Relationships  . Social connections:    Talks on phone: Not on file    Gets together: Not on file    Attends religious  service: Not on file    Active member of club or organization: Not on file    Attends meetings of clubs or organizations: Not on file    Relationship status: Not on file  . Intimate partner violence:    Fear of current or ex partner: Not on file    Emotionally abused: Not on file    Physically abused: Not on file    Forced sexual activity: Not on file  Other Topics Concern  . Not on file  Social History Narrative  . Not on file    History reviewed. No pertinent family history.  BP (!) 130/91   Pulse 96   Ht 5\' 1"  (1.549 m)   Wt 170 lb (77.1 kg)   BMI 32.12 kg/m   Review of Systems: See HPI above.     Objective:  Physical Exam:  Gen: NAD, comfortable in exam room  Left shoulder: No  swelling, ecchymoses.  No gross deformity. TTP medial to right scapula, over infraspinatus. FROM with posterior pain. Negative Hawkins, Neers. Negative Yergasons. Strength 5/5 with empty can and resisted internal/external rotation. Negative apprehension. NV intact distally.  Right shoulder: No swelling, ecchymoses.  No gross deformity. No TTP. FROM. Strength 5/5 with empty can and resisted internal/external rotation. NV intact distally.  Neck: No gross deformity, swelling, bruising. No paraspinal TTP.  No midline/bony TTP. FROM without pain. BUE strength 5/5.   Sensation intact to light touch.   2+ equal reflexes in triceps, biceps, brachioradialis tendons. Negative spurlings. NV intact distal BUEs.   Assessment & Plan:  1. Left shoulder/scapula pain: Patient has had an extensive work-up for her chronic left scapular pain including radiographs of left shoulder, scapula, cervical spine, thoracic spine as well as MRIs of her left shoulder and thoracic spine, nerve conduction studies and all of these have not elicited a cause of her current pain.  She does have radicular symptoms going down the left upper extremity the distribution of her pain from the left scapula into the axilla would be unusual with the cervical radiculopathy feel that to complete the work-up we should go ahead with a cervical spine MRI.  We will go over the results of this and potentially consider injection into the scapulothoracic bursa as a trial if cervical spine MRI does not elicit a cause of her pain either.  We did discuss the possibility of this being a chronic myofascial pain should these 2 measures proved did not find a cause.  Consider Tylenol, Aleve, exercises she learned in therapy, heat in the meantime.  Follow-up will depend on the MRI.

## 2018-08-20 ENCOUNTER — Ambulatory Visit (INDEPENDENT_AMBULATORY_CARE_PROVIDER_SITE_OTHER): Payer: 59

## 2018-08-20 DIAGNOSIS — M50221 Other cervical disc displacement at C4-C5 level: Secondary | ICD-10-CM

## 2018-08-20 DIAGNOSIS — M542 Cervicalgia: Secondary | ICD-10-CM | POA: Diagnosis not present

## 2018-08-21 MED FILL — LISINOPRIL-HCTZ 10-12.5 TAB: 10-12.5 | 90 days supply | Qty: 90 | Fill #0

## 2018-08-23 ENCOUNTER — Ambulatory Visit (INDEPENDENT_AMBULATORY_CARE_PROVIDER_SITE_OTHER): Payer: 59 | Admitting: Family Medicine

## 2018-08-23 DIAGNOSIS — M542 Cervicalgia: Secondary | ICD-10-CM

## 2018-08-23 NOTE — Patient Instructions (Signed)
We will set you up with an epidural injection closer to home for probable C5 radiculopathy. Call me a week after this to let me know how you're doing.

## 2018-08-24 ENCOUNTER — Encounter: Payer: Self-pay | Admitting: Family Medicine

## 2018-08-24 NOTE — Progress Notes (Signed)
Patient came in today to go over MRI results in person.  Showed her images.  She does have a central disc bulge at C4-5 with some flattening of spinal cord; possible branch affects muscle groups in left scapular area and serratus in distribution she has pain.  I advised we trial a therapeutic/diagnostic ESI on the left for possible C5 radiculopathy and see if she gets some relief.  Advised to call us a week after this to let me know how she's doing.

## 2018-08-28 ENCOUNTER — Other Ambulatory Visit: Payer: Self-pay | Admitting: Family Medicine

## 2018-08-28 DIAGNOSIS — M5412 Radiculopathy, cervical region: Secondary | ICD-10-CM

## 2018-09-13 ENCOUNTER — Other Ambulatory Visit: Payer: 59

## 2018-09-13 MED FILL — CARTIA XT 240 MG CAPSULE SA: 240 | 30 days supply | Qty: 30 | Fill #4

## 2018-09-14 ENCOUNTER — Other Ambulatory Visit: Payer: 59

## 2018-09-14 ENCOUNTER — Ambulatory Visit
Admission: RE | Admit: 2018-09-14 | Discharge: 2018-09-14 | Disposition: A | Discharge status: Home / Self Care | Payer: 59 | Source: Ambulatory Visit | Attending: Family Medicine | Admitting: Family Medicine

## 2018-09-14 DIAGNOSIS — M47812 Spondylosis without myelopathy or radiculopathy, cervical region: Secondary | ICD-10-CM | POA: Diagnosis not present

## 2018-09-14 DIAGNOSIS — M5412 Radiculopathy, cervical region: Secondary | ICD-10-CM

## 2018-09-14 DIAGNOSIS — H8112 Benign paroxysmal vertigo, left ear: Secondary | ICD-10-CM | POA: Diagnosis not present

## 2018-09-14 MED ORDER — TRIAMCINOLONE ACETONIDE 40 MG/ML IJ SUSP (RADIOLOGY)
60.0000 mg | Freq: Once | INTRAMUSCULAR | Status: AC
  Administered 2018-09-14: 60 mg via EPIDURAL

## 2018-09-14 MED ORDER — IOPAMIDOL (ISOVUE-M 300) INJECTION 61%
1.0000 mL | Freq: Once | INTRAMUSCULAR | Status: AC | PRN
  Administered 2018-09-14: 1 mL via EPIDURAL

## 2018-09-14 NOTE — Discharge Instructions (Signed)

## 2018-09-21 ENCOUNTER — Telehealth: Payer: Self-pay | Admitting: Family Medicine

## 2018-09-21 MED FILL — cloNIDine HCL 0.1 MG TABS: 0.1 | 30 days supply | Qty: 30 | Fill #4

## 2018-09-21 MED FILL — TRIUMEQ 600-50-300 MG TABS: 600-50-300 | 30 days supply | Qty: 30 | Fill #1

## 2018-09-21 NOTE — Telephone Encounter (Signed)
Patient came into the office to follow up one week after ESI. She states the second day after the injection the pain had gone away. She is not experiencing any pain currently. Patient is very thankful for you referring her to get the [facility]  She asked how long the medication should last and how often the injections can be done.

## 2018-09-21 NOTE — Telephone Encounter (Signed)
Patient was informed and will call if pain comes back

## 2018-09-21 NOTE — Telephone Encounter (Signed)
I'm glad to hear that.  Obviously our hope is that this is a permanent fix for her and she doesn't need to get them again.  However, if it recurs she can have at most 3 injections in a 6 month period.

## 2018-10-13 DIAGNOSIS — Z1231 Encounter for screening mammogram for malignant neoplasm of breast: Secondary | ICD-10-CM | POA: Diagnosis not present

## 2018-10-17 MED FILL — TRIUMEQ 600-50-300 MG TABS: 600-50-300 | 30 days supply | Qty: 30 | Fill #2

## 2018-11-01 MED FILL — cloNIDine HCL 0.1 MG TABS: 0.1 | 30 days supply | Qty: 30 | Fill #5

## 2018-11-01 MED FILL — CARTIA XT 240 MG CAPSULE SA: 240 | 30 days supply | Qty: 30 | Fill #5

## 2018-11-27 MED FILL — TRIUMEQ 600-50-300 MG TABS: 600-50-300 | 30 days supply | Qty: 30 | Fill #3

## 2018-12-03 ENCOUNTER — Telehealth: Payer: Self-pay | Admitting: *Deleted

## 2018-12-03 DIAGNOSIS — M542 Cervicalgia: Secondary | ICD-10-CM

## 2018-12-03 NOTE — Telephone Encounter (Signed)
Order placed for patient to have a 2nd ESI

## 2018-12-10 MED FILL — cloNIDine HCL 0.1 MG TABS: 0.1 | 30 days supply | Qty: 30 | Fill #0

## 2018-12-12 MED FILL — CARTIA XT 240 MG CAPSULE SA: 240 | 90 days supply | Qty: 90 | Fill #0

## 2018-12-23 IMAGING — MR MR SHOULDER*L* WO/W CM
8 series · 40 of 40 positions shown · IV contrast (multihance)
Comparison: None.

CLINICAL DATA: Chronic left scapular set left scapular pain for 6
years. No recent injury.

EXAM:
MRI OF THE LEFT SCAPULA WITHOUT AND WITH CONTRAST
TECHNIQUE: Multiplanar, multisequence MR imaging of the left scapula was
performed before and after the administration of intravenous
contrast.
CONTRAST:  15 cc MultiHance IV.

[Series 3: T2 fat-sat · axial · 5.0mm · 0.70mm/px · z∈[-150,+23]mm · 5 of 30 slices shown]
[im 1/30]
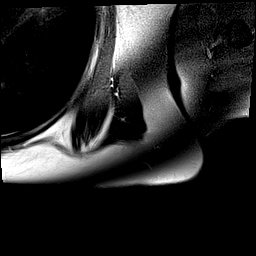
[im 8/30]
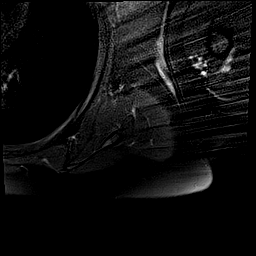
[im 15/30]
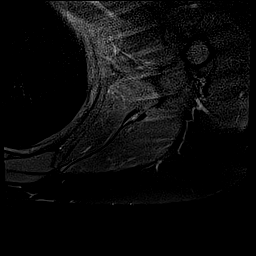
[im 22/30]
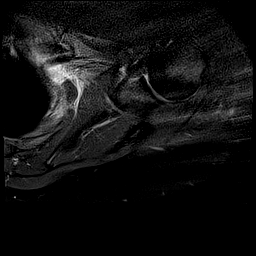
[im 30/30]
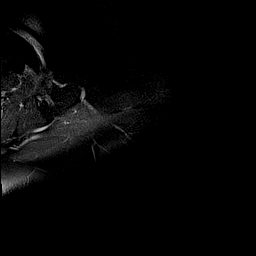

[Series 4: T1 · axial · 5.0mm · 0.70mm/px · z∈[-156,+16]mm · 6 of 30 slices shown (1 of 2)]
[im 1/30]
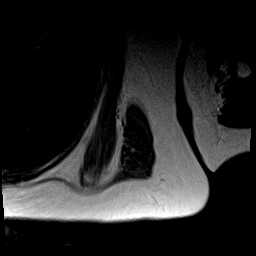
[im 6/30]
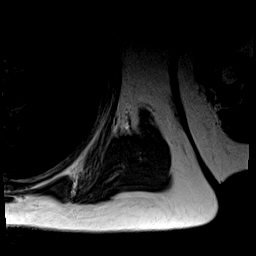
[im 12/30]
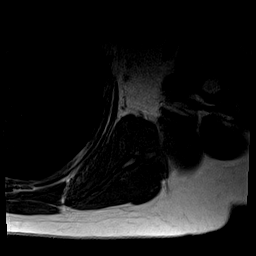
[im 18/30]
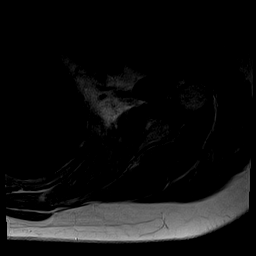
[im 24/30]
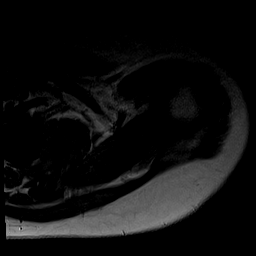
[im 30/30]
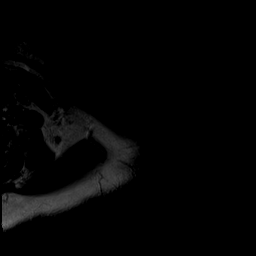

[Series 5: T1 fat-sat · axial · 5.0mm · 0.70mm/px · z∈[-156,+16]mm · 6 of 30 slices shown]
[im 1/30]
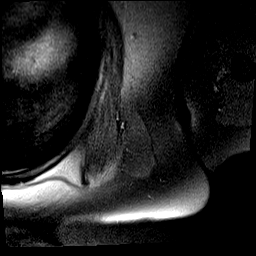
[im 6/30]
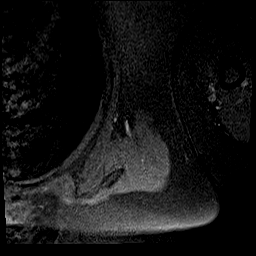
[im 12/30]
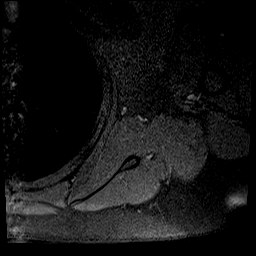
[im 18/30]
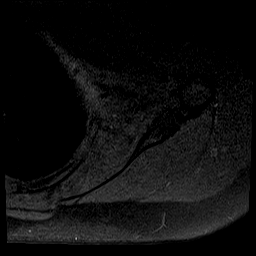
[im 24/30]
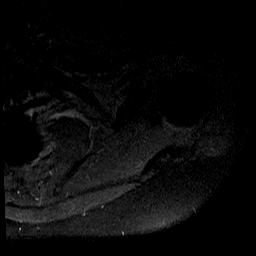
[im 30/30]
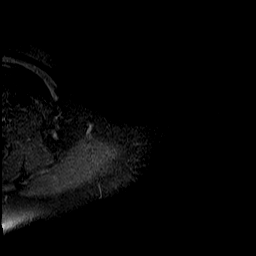

[Series 6: STIR · oblique · 5.0mm · 0.94mm/px · 5 of 28 slices shown (1 of 2)]
[im 1/28]
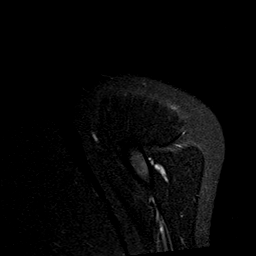
[im 7/28]
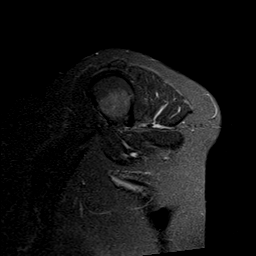
[im 14/28]
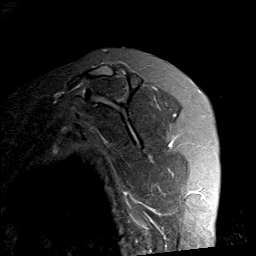
[im 21/28]
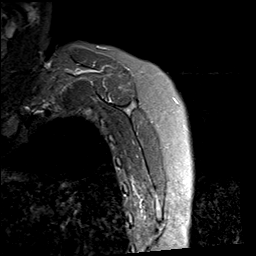
[im 28/28]
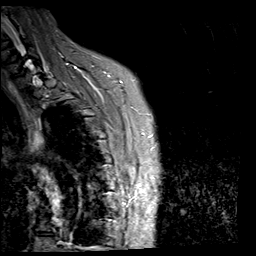

[Series 7: STIR · oblique · 5.0mm · 0.94mm/px · 4 of 20 slices shown (2 of 2)]
[im 1/20]
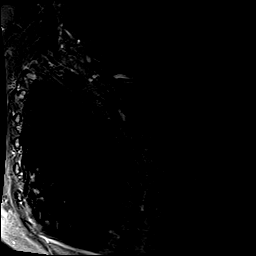
[im 7/20]
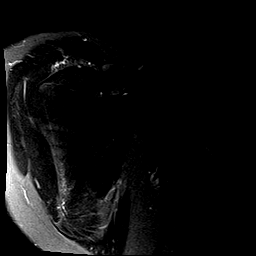
[im 13/20]
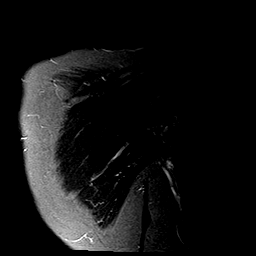
[im 20/20]
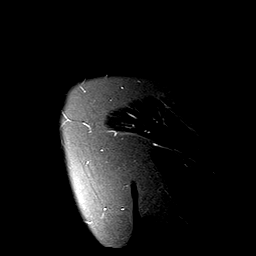

[Series 8: T1 · oblique · 5.0mm · 0.94mm/px · 4 of 20 slices shown (2 of 2)]
[im 1/20]
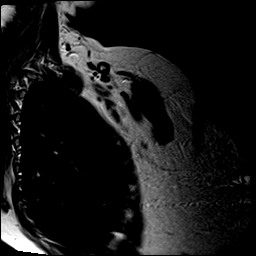
[im 7/20]
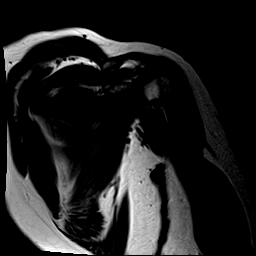
[im 13/20]
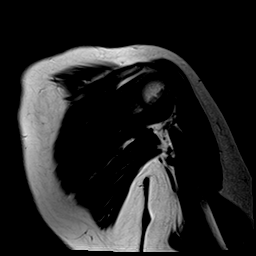
[im 20/20]
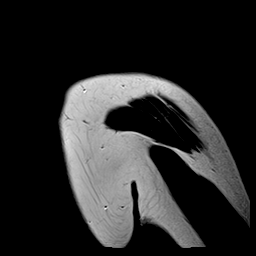

[Series 9: T1 fat-sat post-contrast · axial · 5.0mm · 0.70mm/px · z∈[-156,+16]mm · 6 of 30 slices shown (1 of 2)]
[im 1/30]
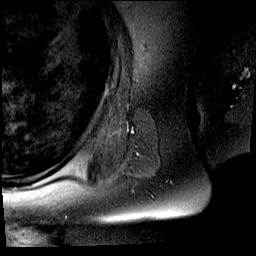
[im 6/30]
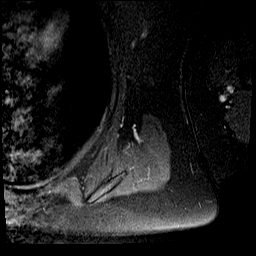
[im 12/30]
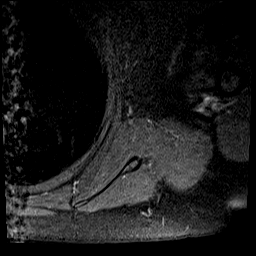
[im 18/30]
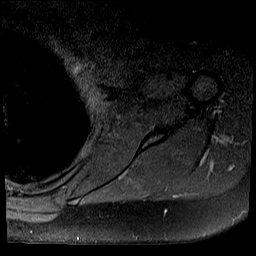
[im 24/30]
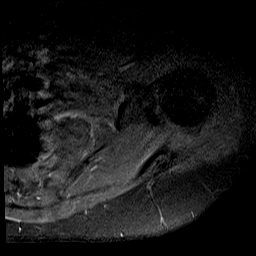
[im 30/30]
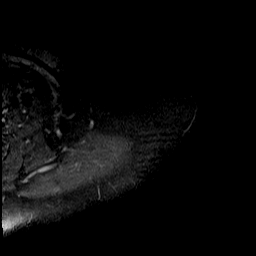

[Series 10: T1 fat-sat post-contrast · oblique · 5.0mm · 0.94mm/px · 4 of 20 slices shown (2 of 2)]
[im 1/20]
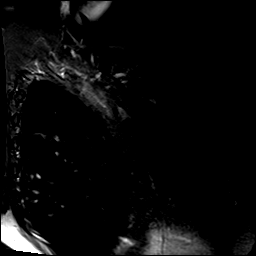
[im 7/20]
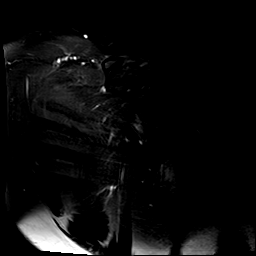
[im 13/20]
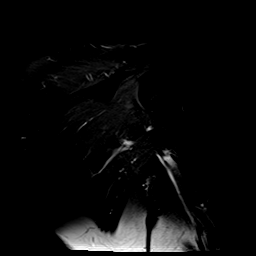
[im 20/20]
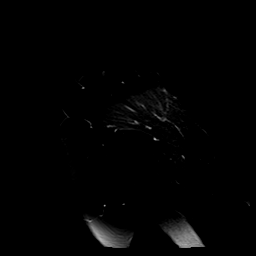

[40 of 40 positions shown; findings below may reference images not displayed]

FINDINGS: Marrow signal is normal throughout the scapula and in all imaged
bones without fracture, stress change or focal lesion. All imaged
musculature is intact and normal in appearance. The
acromioclavicular and glenohumeral joints appear normal. No mass or
fluid collection is seen. Imaged intrathoracic contents are
unremarkable. No pathologic enhancement after contrast
administration is seen.
IMPRESSION: Normal examination.  No finding to explain the patient's symptoms.

## 2019-01-01 MED FILL — LISINOPRIL-HCTZ 10-12.5 TAB: 10-12.5 | 90 days supply | Qty: 90 | Fill #0

## 2019-01-04 ENCOUNTER — Other Ambulatory Visit: Payer: Self-pay | Admitting: Pharmacist

## 2019-01-04 ENCOUNTER — Other Ambulatory Visit: Payer: Self-pay | Admitting: Family Medicine

## 2019-01-04 DIAGNOSIS — M5412 Radiculopathy, cervical region: Secondary | ICD-10-CM

## 2019-01-04 MED ORDER — ABACAVIR-DOLUTEGRAVIR-LAMIVUD 600-50-300 MG PO TABS: 1.0000 | ORAL_TABLET | Freq: Every day | ORAL | 5 refills | Status: DC

## 2019-01-04 MED FILL — TRIUMEQ 600-50-300 MG TABS: 600-50-300 | 30 days supply | Qty: 30 | Fill #0

## 2019-01-14 MED FILL — cloNIDine HCL 0.1 MG TABS: 0.1 | 30 days supply | Qty: 30 | Fill #1

## 2019-01-15 ENCOUNTER — Ambulatory Visit
Admission: RE | Admit: 2019-01-15 | Discharge: 2019-01-15 | Disposition: A | Discharge status: Home / Self Care | Payer: 59 | Source: Ambulatory Visit | Attending: Family Medicine | Admitting: Family Medicine

## 2019-01-15 DIAGNOSIS — M5412 Radiculopathy, cervical region: Secondary | ICD-10-CM

## 2019-01-15 DIAGNOSIS — M50221 Other cervical disc displacement at C4-C5 level: Secondary | ICD-10-CM | POA: Diagnosis not present

## 2019-01-15 MED ORDER — TRIAMCINOLONE ACETONIDE 40 MG/ML IJ SUSP (RADIOLOGY)
60.0000 mg | Freq: Once | INTRAMUSCULAR | Status: AC
  Administered 2019-01-15: 60 mg via EPIDURAL

## 2019-01-15 MED ORDER — IOPAMIDOL (ISOVUE-M 300) INJECTION 61%
1.0000 mL | Freq: Once | INTRAMUSCULAR | Status: AC | PRN
  Administered 2019-01-15: 1 mL via EPIDURAL

## 2019-01-21 DIAGNOSIS — Z79899 Other long term (current) drug therapy: Secondary | ICD-10-CM | POA: Diagnosis not present

## 2019-01-21 DIAGNOSIS — Z8719 Personal history of other diseases of the digestive system: Secondary | ICD-10-CM | POA: Diagnosis not present

## 2019-01-21 DIAGNOSIS — R740 Nonspecific elevation of levels of transaminase and lactic acid dehydrogenase [LDH]: Secondary | ICD-10-CM | POA: Diagnosis not present

## 2019-01-21 DIAGNOSIS — B20 Human immunodeficiency virus [HIV] disease: Secondary | ICD-10-CM | POA: Diagnosis not present

## 2019-01-24 DIAGNOSIS — Z01419 Encounter for gynecological examination (general) (routine) without abnormal findings: Secondary | ICD-10-CM | POA: Diagnosis not present

## 2019-01-24 DIAGNOSIS — B373 Candidiasis of vulva and vagina: Secondary | ICD-10-CM | POA: Diagnosis not present

## 2019-01-24 MED FILL — FLUCONAZOLE 150 MG TABS: 150 | 3 days supply | Qty: 2 | Fill #0

## 2019-02-13 MED FILL — cloNIDine HCL 0.1 MG TABS: 0.1 | 30 days supply | Qty: 30 | Fill #2

## 2019-02-13 MED FILL — TRIUMEQ 600-50-300 MG TABS: 600-50-300 | 30 days supply | Qty: 30 | Fill #1

## 2019-02-18 DIAGNOSIS — N938 Other specified abnormal uterine and vaginal bleeding: Secondary | ICD-10-CM | POA: Diagnosis not present

## 2019-02-18 MED FILL — NORETHINDRONE 5 MG TABLET: 5 | 14 days supply | Qty: 28 | Fill #0

## 2019-03-04 DIAGNOSIS — B354 Tinea corporis: Secondary | ICD-10-CM | POA: Diagnosis not present

## 2019-03-04 MED FILL — KETOCONAZOLE 2% CREAM: 2 | 30 days supply | Qty: 30 | Fill #0

## 2019-03-13 MED FILL — TRIUMEQ 600-50-300 MG TABS: 600-50-300 | 30 days supply | Qty: 30 | Fill #2

## 2019-03-13 MED FILL — CLONIDINE HCL 0.1 MG TABS: 0.1 | 30 days supply | Qty: 30 | Fill #3

## 2019-03-25 MED FILL — TRIAMCINOLONE 0.1% CREAM: 0.1 | 30 days supply | Qty: 45 | Fill #0

## 2019-04-02 DIAGNOSIS — L819 Disorder of pigmentation, unspecified: Secondary | ICD-10-CM | POA: Diagnosis not present

## 2019-04-02 DIAGNOSIS — L42 Pityriasis rosea: Secondary | ICD-10-CM | POA: Diagnosis not present

## 2019-04-04 MED FILL — CARTIA XT 240 MG CAPSULE SA: 240 | 30 days supply | Qty: 30 | Fill #1

## 2019-04-08 MED FILL — TRIAMCINOLONE 0.1% CREAM: 0.1 | 20 days supply | Qty: 60 | Fill #0

## 2019-04-17 MED FILL — TRIUMEQ 600-50-300 MG TABS: 600-50-300 | 30 days supply | Qty: 30 | Fill #3

## 2019-04-29 MED FILL — CLONIDINE HCL 0.1 MG TABS: 0.1 | 30 days supply | Qty: 30 | Fill #4

## 2019-05-22 MED FILL — TRIUMEQ 600-50-300 MG TABS: 600-50-300 | 30 days supply | Qty: 30 | Fill #4

## 2019-05-30 DIAGNOSIS — L853 Xerosis cutis: Secondary | ICD-10-CM | POA: Diagnosis not present

## 2019-05-30 DIAGNOSIS — D485 Neoplasm of uncertain behavior of skin: Secondary | ICD-10-CM | POA: Diagnosis not present

## 2019-05-30 DIAGNOSIS — L410 Pityriasis lichenoides et varioliformis acuta: Secondary | ICD-10-CM | POA: Diagnosis not present

## 2019-05-30 DIAGNOSIS — L814 Other melanin hyperpigmentation: Secondary | ICD-10-CM | POA: Diagnosis not present

## 2019-05-30 MED FILL — TRIAMCINOLONE 0.1% CREAM: 0.1 | 20 days supply | Qty: 60 | Fill #0

## 2019-06-03 MED FILL — CARTIA XT 240 MG CAPSULE SA: 240 | 30 days supply | Qty: 30 | Fill #2

## 2019-06-03 MED FILL — CLONIDINE HCL 0.1 MG TABS: 0.1 | 30 days supply | Qty: 30 | Fill #5

## 2019-06-04 MED FILL — LISINOPRIL-HCTZ 10-12.5 MG: 10-12.5 | 30 days supply | Qty: 30 | Fill #0

## 2019-06-07 MED FILL — DOXYCYCLINE HYCLATE 100 MG: 100 | 30 days supply | Qty: 60 | Fill #0

## 2019-06-11 DIAGNOSIS — F322 Major depressive disorder, single episode, severe without psychotic features: Secondary | ICD-10-CM | POA: Diagnosis not present

## 2019-06-11 DIAGNOSIS — R351 Nocturia: Secondary | ICD-10-CM | POA: Diagnosis not present

## 2019-06-11 DIAGNOSIS — F419 Anxiety disorder, unspecified: Secondary | ICD-10-CM | POA: Diagnosis not present

## 2019-06-11 DIAGNOSIS — I10 Essential (primary) hypertension: Secondary | ICD-10-CM | POA: Diagnosis not present

## 2019-06-11 DIAGNOSIS — B20 Human immunodeficiency virus [HIV] disease: Secondary | ICD-10-CM | POA: Diagnosis not present

## 2019-06-11 MED FILL — LISINOPRIL 10 MG TABLET: 10 | 90 days supply | Qty: 90 | Fill #0

## 2019-06-11 MED FILL — ESCITALOPRAM 5 MG TABLET: 5 | 30 days supply | Qty: 30 | Fill #0

## 2019-06-20 MED FILL — TRIUMEQ 600-50-300 MG TABS: 600-50-300 | 30 days supply | Qty: 30 | Fill #5

## 2019-07-29 ENCOUNTER — Other Ambulatory Visit: Payer: Self-pay | Admitting: Pharmacist

## 2019-07-29 MED ORDER — TRIUMEQ 600-50-300 MG PO TABS: 1.0000 | ORAL_TABLET | Freq: Every day | ORAL | 3 refills | Status: DC

## 2019-07-29 MED FILL — TRIUMEQ 600-50-300 MG TABS: 600-50-300 | 30 days supply | Qty: 30 | Fill #0

## 2019-08-09 MED FILL — CARTIA XT 240 MG CAPSULE SA: 240 | 30 days supply | Qty: 30 | Fill #3

## 2019-09-03 MED FILL — TRIUMEQ 600-50-300 MG TABS: 600-50-300 | 30 days supply | Qty: 30 | Fill #1

## 2019-09-12 DIAGNOSIS — N898 Other specified noninflammatory disorders of vagina: Secondary | ICD-10-CM | POA: Diagnosis not present

## 2019-09-12 DIAGNOSIS — R300 Dysuria: Secondary | ICD-10-CM | POA: Diagnosis not present

## 2019-09-12 MED FILL — CIPROFLOXACIN HCL 500 MG TA: 500 | 7 days supply | Qty: 14 | Fill #0

## 2019-09-23 MED FILL — FLUCONAZOLE 150 MG TABS: 150 | 4 days supply | Qty: 2 | Fill #0

## 2019-10-01 MED FILL — CARTIA XT 240 MG CAPSULE SA: 240 | 90 days supply | Qty: 90 | Fill #0

## 2019-10-07 DIAGNOSIS — Z79899 Other long term (current) drug therapy: Secondary | ICD-10-CM | POA: Diagnosis not present

## 2019-10-07 DIAGNOSIS — F329 Major depressive disorder, single episode, unspecified: Secondary | ICD-10-CM | POA: Diagnosis not present

## 2019-10-07 DIAGNOSIS — B20 Human immunodeficiency virus [HIV] disease: Secondary | ICD-10-CM | POA: Diagnosis not present

## 2019-10-07 DIAGNOSIS — F322 Major depressive disorder, single episode, severe without psychotic features: Secondary | ICD-10-CM | POA: Diagnosis not present

## 2019-10-07 MED FILL — TRIUMEQ 600-50-300 MG TABS: 600-50-300 | 30 days supply | Qty: 30 | Fill #2

## 2019-10-21 DIAGNOSIS — Z202 Contact with and (suspected) exposure to infections with a predominantly sexual mode of transmission: Secondary | ICD-10-CM | POA: Diagnosis not present

## 2019-10-21 DIAGNOSIS — N898 Other specified noninflammatory disorders of vagina: Secondary | ICD-10-CM | POA: Diagnosis not present

## 2019-10-21 DIAGNOSIS — B20 Human immunodeficiency virus [HIV] disease: Secondary | ICD-10-CM | POA: Diagnosis not present

## 2019-10-21 DIAGNOSIS — N760 Acute vaginitis: Secondary | ICD-10-CM | POA: Diagnosis not present

## 2019-10-21 MED FILL — METRONIDAZOLE 500 MG TABS: 500 | 1 days supply | Qty: 4 | Fill #0

## 2019-10-24 MED FILL — METRONIDAZOLE 500 MG TABS: 500 | 7 days supply | Qty: 14 | Fill #0

## 2019-11-07 MED FILL — TRIUMEQ 600-50-300 MG TABS: 600-50-300 | 30 days supply | Qty: 30 | Fill #3

## 2019-11-29 DIAGNOSIS — Z1231 Encounter for screening mammogram for malignant neoplasm of breast: Secondary | ICD-10-CM | POA: Diagnosis not present

## 2019-12-06 ENCOUNTER — Telehealth: Payer: Self-pay | Admitting: Pharmacist

## 2019-12-06 NOTE — Telephone Encounter (Signed)
Called patient to discuss Triumeq prescription as she is overdue to meet with our [facility]. Her number listed states that it is out of service. Sent an email to her [employer]. Hopefully she will respond.

## 2019-12-09 ENCOUNTER — Telehealth: Payer: Self-pay | Admitting: Pharmacy Technician

## 2019-12-09 ENCOUNTER — Telehealth: Payer: Self-pay | Admitting: Pharmacist

## 2019-12-09 DIAGNOSIS — B20 Human immunodeficiency virus [HIV] disease: Secondary | ICD-10-CM

## 2019-12-09 MED ORDER — TRIUMEQ 600-50-300 MG PO TABS: 1.0000 | ORAL_TABLET | Freq: Every day | ORAL | 2 refills | Status: DC

## 2019-12-09 MED FILL — TRIUMEQ 600-50-300 MG TABS: 600-50-300 | 30 days supply | Qty: 30 | Fill #0

## 2019-12-09 NOTE — Telephone Encounter (Signed)
Spoke with patient today regarding her Triumeq prescription.  She is part of the [facility] Management program and was last seen in August of 2019.  She is doing quite well on the Triumeq with no issues or questions for me. She states that she doesn't miss any doses and takes it at night to avoid any side effects. No questions or concerns today.  Will resend Rx to [facility] and follow up with patient in one year.

## 2020-01-08 MED FILL — TRIUMEQ 600-50-300 MG TABS: 600-50-300 | 30 days supply | Qty: 30 | Fill #0

## 2020-01-28 DIAGNOSIS — Z124 Encounter for screening for malignant neoplasm of cervix: Secondary | ICD-10-CM | POA: Diagnosis not present

## 2020-01-28 DIAGNOSIS — Z01419 Encounter for gynecological examination (general) (routine) without abnormal findings: Secondary | ICD-10-CM | POA: Diagnosis not present

## 2020-01-31 MED FILL — CARTIA XT 240 MG CAPSULE SA: 240 | 30 days supply | Qty: 30 | Fill #1

## 2020-01-31 MED FILL — TRIUMEQ 600-50-300 MG TABS: 600-50-300 | 30 days supply | Qty: 30 | Fill #1

## 2020-02-26 IMAGING — XA DG INJECT/[PERSON_NAME] INC NEEDLE/CATH/PLC EPI/CERV/THOR W/IMG
2 series · 2 of 2 positions shown · non-contrast
Comparison: none

CLINICAL DATA: Cervical radiculopathy. Displacement of the C4-5
cervical disc.

[Series 1: ortho standard · 1 of 1 slices shown (1 of 2)]
[im 1/1]
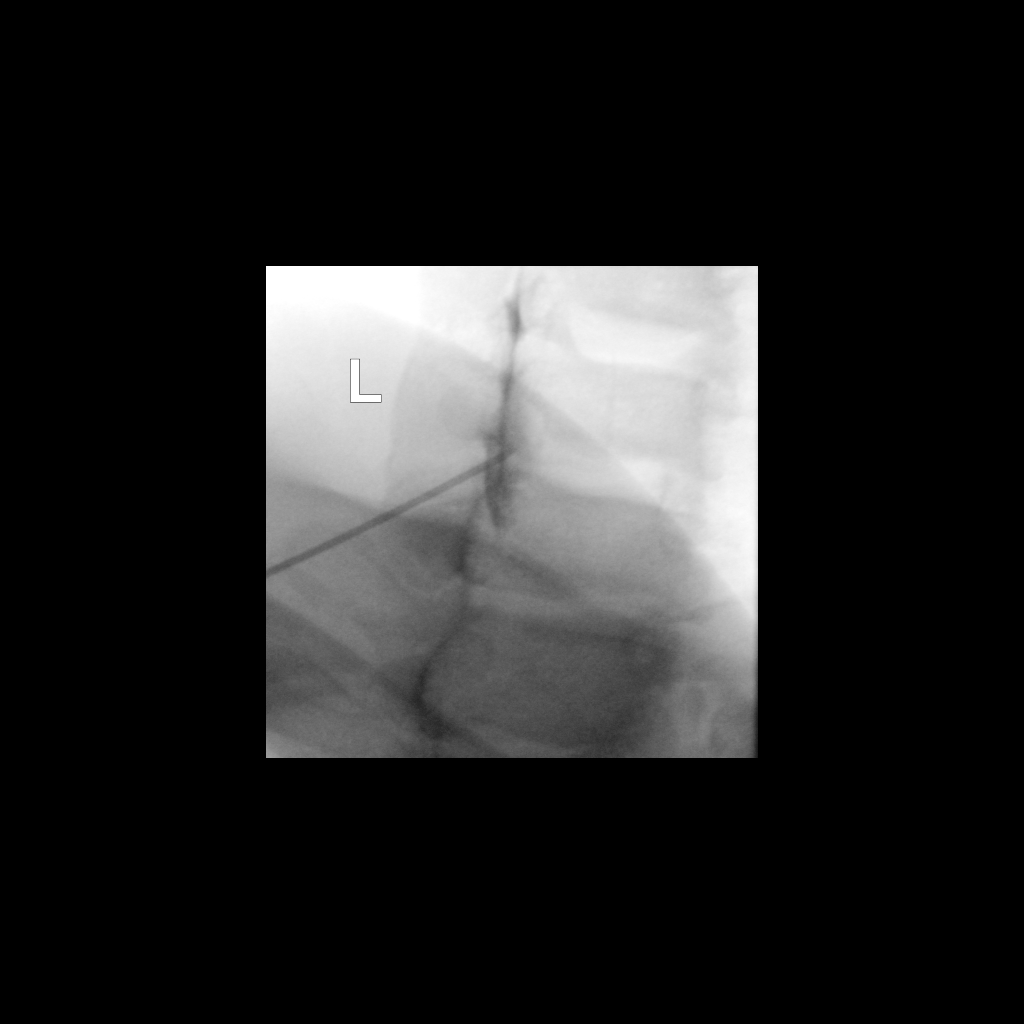

[Series 2: ortho standard · 1 of 1 slices shown (2 of 2)]
[im 1/1]
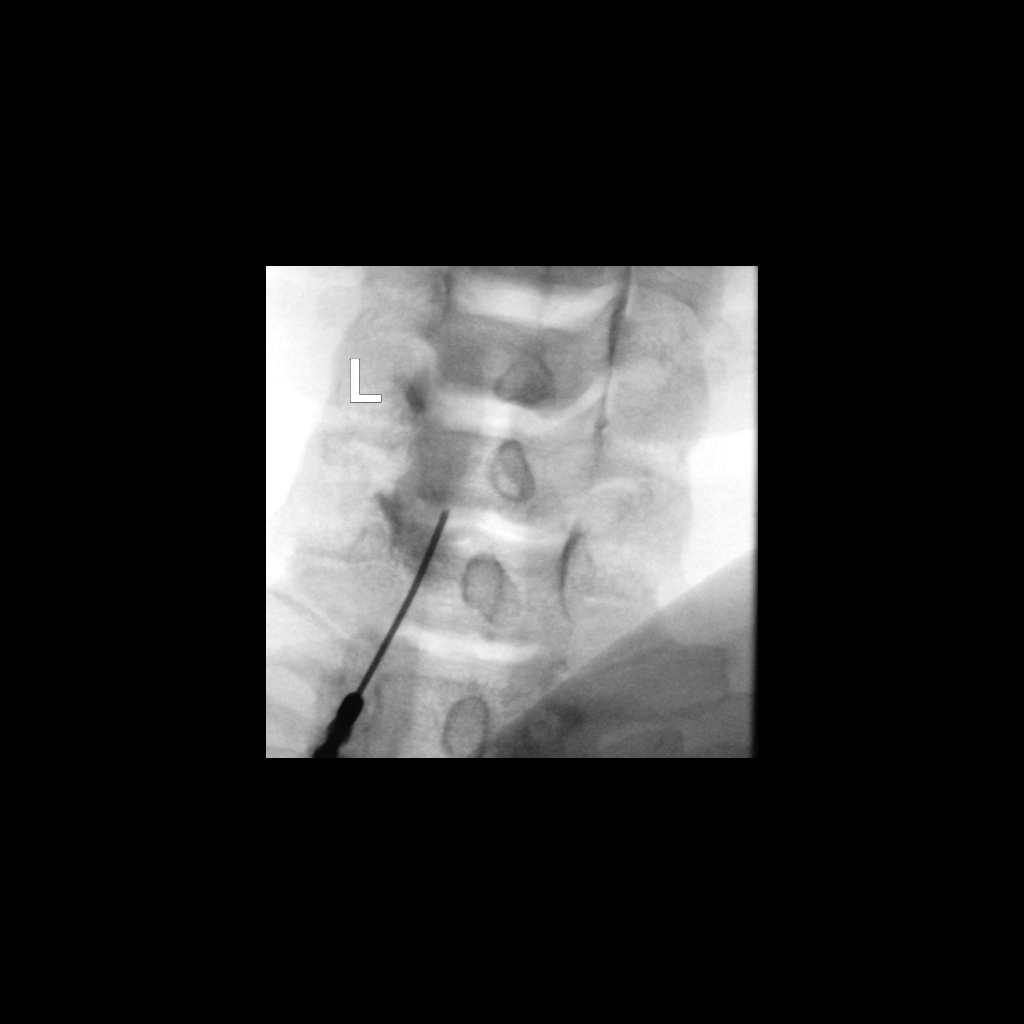

[2 of 2 positions shown; findings below may reference images not displayed]

FLUOROSCOPY TIME:  Radiation Exposure Index (as provided by the
fluoroscopic device): 12.56 uGy*m2

Fluoroscopy Time:  17 seconds

Number of Acquired Images:  0

PROCEDURE:
CERVICAL EPIDURAL INJECTION

An interlaminar approach was performed on the left at C6-7. A 20
gauge epidural needle was advanced using loss-of-resistance
technique.

DIAGNOSTIC EPIDURAL INJECTION

Injection of Isovue-M 300 shows a good epidural pattern with spread
above and below the level of needle placement, primarily on the
left. No vascular opacification is seen. THERAPEUTIC

EPIDURAL INJECTION

1.5 ml of Kenalog 40 mixed with 1 ml of 1% Lidocaine and 2 ml of
normal saline were then instilled. The procedure was well-tolerated,
and the patient was discharged thirty minutes following the
injection in good condition.
IMPRESSION: Technically successful first epidural injection on the left at C6-7.

## 2020-02-27 MED FILL — TRIUMEQ 600-50-300 MG TABS: 600-50-300 | 30 days supply | Qty: 30 | Fill #2

## 2020-03-24 ENCOUNTER — Other Ambulatory Visit: Payer: Self-pay | Admitting: Pharmacist

## 2020-03-24 DIAGNOSIS — B20 Human immunodeficiency virus [HIV] disease: Secondary | ICD-10-CM

## 2020-03-24 MED FILL — TRIUMEQ 600-50-300 MG TABS: 600-50-300 | 30 days supply | Qty: 30 | Fill #0

## 2020-04-06 DIAGNOSIS — B20 Human immunodeficiency virus [HIV] disease: Secondary | ICD-10-CM | POA: Diagnosis not present

## 2020-04-06 DIAGNOSIS — F322 Major depressive disorder, single episode, severe without psychotic features: Secondary | ICD-10-CM | POA: Diagnosis not present

## 2020-04-06 DIAGNOSIS — Z79899 Other long term (current) drug therapy: Secondary | ICD-10-CM | POA: Diagnosis not present

## 2020-04-20 MED FILL — CARTIA XT 240 MG CAPSULE SA: 240 | 30 days supply | Qty: 30 | Fill #0

## 2020-05-12 MED FILL — TRIUMEQ 600-50-300 MG TABS: 600-50-300 | 30 days supply | Qty: 30 | Fill #1

## 2020-05-18 ENCOUNTER — Ambulatory Visit (INDEPENDENT_AMBULATORY_CARE_PROVIDER_SITE_OTHER): Payer: 59 | Admitting: Family Medicine

## 2020-05-18 ENCOUNTER — Other Ambulatory Visit: Payer: Self-pay

## 2020-05-18 ENCOUNTER — Encounter: Payer: Self-pay | Admitting: Family Medicine

## 2020-05-18 VITALS — BP 127/83 | HR 86 | Ht 61.0 in

## 2020-05-18 DIAGNOSIS — M792 Neuralgia and neuritis, unspecified: Secondary | ICD-10-CM | POA: Diagnosis not present

## 2020-05-18 MED ORDER — GABAPENTIN 100 MG PO CAPS: 100.0000 mg | ORAL_CAPSULE | Freq: Three times a day (TID) | ORAL | 0 refills | Status: DC

## 2020-05-18 MED FILL — GABAPENTIN 100 MG CAPSULE: 100 | 30 days supply | Qty: 90 | Fill #0

## 2020-05-18 NOTE — Patient Instructions (Signed)
Nice to meet you Please try the gabapentin. You can start with one pill at night. It may make you sleepy. Please increase to 2 or 3 pills daily as you tolerate. You can increase a pill every 3-4 days.  You should get a call to set up the epidural   Please send me a message in MyChart with any questions or updates.  Please see me back in 4 weeks.   --Dr. Raeford Razor

## 2020-05-18 NOTE — Progress Notes (Signed)
Cindy Kirk - 44 y.o. female MRN [id number]  Date of birth: 08-25-1976  SUBJECTIVE:  Including CC & ROS.  No chief complaint on file.   Cindy Kirk is a 44 y.o. female that is she is presenting with acute on chronic pain that is periscapular in nature.  She has had a significant work-up with the cervical MRI as well as a left shoulder MRI.  The MRI of the cervical spine was showing some stenosis at one level.  She has tried epidural injections with do seem to improve her pain but the pain tends to return.  The pain is exacerbated with any form of movement.  She did report a fall about 10 years ago and the pain started after that.  Denies any radicular pain down the left arm.   Review of Systems See HPI   HISTORY: Past Medical, Surgical, Social, and Family History Reviewed & Updated per EMR.   Pertinent Historical Findings include:  Past Medical History:  Diagnosis Date  . Hypertension     No past surgical history on file.  No family history on file.  Social History   Socioeconomic History  . Marital status: Single    Spouse name: Not on file  . Number of children: Not on file  . Years of education: Not on file  . Highest education level: Not on file  Occupational History  . Not on file  Tobacco Use  . Smoking status: Never Smoker  . Smokeless tobacco: Never Used  Substance and Sexual Activity  . Alcohol use: No  . Drug use: Not on file  . Sexual activity: Not on file  Other Topics Concern  . Not on file  Social History Narrative  . Not on file   Social Determinants of Health   Financial Resource Strain:   . Difficulty of Paying Living Expenses:   Food Insecurity:   . Worried About Charity fundraiser in the Last Year:   . Arboriculturist in the Last Year:   Transportation Needs:   . Film/video editor (Medical):   Marland Kitchen Lack of Transportation (Non-Medical):   Physical Activity:   . Days of Exercise per Week:   . Minutes of Exercise per Session:     Stress:   . Feeling of Stress :   Social Connections:   . Frequency of Communication with Friends and Family:   . Frequency of Social Gatherings with Friends and Family:   . Attends Religious Services:   . Active Member of Clubs or Organizations:   . Attends Archivist Meetings:   Marland Kitchen Marital Status:   Intimate Partner Violence:   . Fear of Current or Ex-Partner:   . Emotionally Abused:   Marland Kitchen Physically Abused:   . Sexually Abused:      PHYSICAL EXAM:  VS: There were no vitals taken for this visit. Physical Exam Gen: NAD, alert, cooperative with exam, well-appearing MSK:  Neck: Normal range of motion. Normal passive shoulder flexion and abduction. No signs of atrophy. No winging of the scapula. Normal grip strength Neurovascular intact     ASSESSMENT & PLAN:   Neuropathic pain Cervical MRI was showing a level of stenosis which could be contributing to her symptoms.  She could also have complex regional pain syndrome of this area as to why it is a nonspecific type of pain.  MRI of the lower extremity was nonconclusive. -Gabapentin -Epidural. -Counseled on home exercise therapy and supportive care. -Could consider referral to  neurosurgery.

## 2020-05-18 NOTE — Assessment & Plan Note (Signed)
Cervical MRI was showing a level of stenosis which could be contributing to her symptoms.  She could also have complex regional pain syndrome of this area as to why it is a nonspecific type of pain.  MRI of the lower extremity was nonconclusive. -Gabapentin -Epidural. -Counseled on home exercise therapy and supportive care. -Could consider referral to neurosurgery.

## 2020-06-05 ENCOUNTER — Inpatient Hospital Stay: Admission: RE | Admit: 2020-06-05 | Payer: 59 | Source: Ambulatory Visit

## 2020-06-09 MED FILL — TRIUMEQ 600-50-300 MG TABS: 600-50-300 | 30 days supply | Qty: 30 | Fill #2

## 2020-06-23 ENCOUNTER — Other Ambulatory Visit: Payer: 59

## 2020-06-26 DIAGNOSIS — R5383 Other fatigue: Secondary | ICD-10-CM | POA: Diagnosis not present

## 2020-06-26 DIAGNOSIS — E559 Vitamin D deficiency, unspecified: Secondary | ICD-10-CM | POA: Diagnosis not present

## 2020-06-26 DIAGNOSIS — R252 Cramp and spasm: Secondary | ICD-10-CM | POA: Diagnosis not present

## 2020-07-01 MED FILL — VIT D2 1.25 MG (50,000 UNIT: 1.25 MG | 84 days supply | Qty: 12 | Fill #0

## 2020-07-02 MED FILL — TRIUMEQ 600-50-300 MG TABS: 600-50-300 | 30 days supply | Qty: 30 | Fill #3

## 2020-07-17 MED FILL — CARTIA XT 240 MG CAPSULE SA: 240 | 30 days supply | Qty: 30 | Fill #1

## 2020-07-29 MED FILL — TRIUMEQ 600-50-300 MG TABS: 600-50-300 | 30 days supply | Qty: 30 | Fill #4

## 2020-08-24 MED FILL — TRIUMEQ 600-50-300 MG TABS: 600-50-300 | 30 days supply | Qty: 30 | Fill #5

## 2020-08-26 MED FILL — CARTIA XT 240 MG CAPSULE SA: 240 | 30 days supply | Qty: 30 | Fill #2

## 2020-09-07 ENCOUNTER — Other Ambulatory Visit: Payer: Self-pay | Admitting: Family Medicine

## 2020-09-07 DIAGNOSIS — N939 Abnormal uterine and vaginal bleeding, unspecified: Secondary | ICD-10-CM | POA: Diagnosis not present

## 2020-09-11 DIAGNOSIS — N858 Other specified noninflammatory disorders of uterus: Secondary | ICD-10-CM | POA: Diagnosis not present

## 2020-09-11 DIAGNOSIS — N920 Excessive and frequent menstruation with regular cycle: Secondary | ICD-10-CM | POA: Diagnosis not present

## 2020-09-11 DIAGNOSIS — N888 Other specified noninflammatory disorders of cervix uteri: Secondary | ICD-10-CM | POA: Diagnosis not present

## 2020-09-14 MED FILL — GABAPENTIN 100 MG CAPSULE: 100 | 30 days supply | Qty: 90 | Fill #0

## 2020-09-18 DIAGNOSIS — E559 Vitamin D deficiency, unspecified: Secondary | ICD-10-CM | POA: Diagnosis not present

## 2020-09-22 MED FILL — TRIUMEQ 600-50-300 MG TABS: 600-50-300 | 30 days supply | Qty: 30 | Fill #6

## 2020-09-23 NOTE — Telephone Encounter (Signed)
error 

## 2020-10-01 ENCOUNTER — Other Ambulatory Visit (HOSPITAL_BASED_OUTPATIENT_CLINIC_OR_DEPARTMENT_OTHER): Payer: Self-pay | Admitting: Family Medicine

## 2020-10-01 MED FILL — LISINOPRIL 10 MG TABS: 10 | 90 days supply | Qty: 90 | Fill #0

## 2020-10-05 ENCOUNTER — Other Ambulatory Visit (HOSPITAL_BASED_OUTPATIENT_CLINIC_OR_DEPARTMENT_OTHER): Payer: Self-pay | Admitting: Family Medicine

## 2020-10-05 MED FILL — CARTIA XT 240 MG CAPSULE SA: 240 | 90 days supply | Qty: 90 | Fill #0

## 2020-10-14 ENCOUNTER — Other Ambulatory Visit: Payer: Self-pay | Admitting: Pharmacist

## 2020-10-14 DIAGNOSIS — B20 Human immunodeficiency virus [HIV] disease: Secondary | ICD-10-CM

## 2020-10-14 MED ORDER — TRIUMEQ 600-50-300 MG PO TABS: 1.0000 | ORAL_TABLET | Freq: Every day | ORAL | 1 refills | Status: DC

## 2020-10-14 NOTE — Progress Notes (Signed)
Refill sent in by patient's doctor, Lilyan Gilford. Resent Rx to [facility]. Patient was last seen in our [facility] on December 2020. No follow up needed at this time.

## 2020-10-16 MED FILL — TRIUMEQ 600-50-300 MG TABS: 600-50-300 | 30 days supply | Qty: 30 | Fill #0

## 2020-11-12 MED FILL — TRIUMEQ 600-50-300 MG TABS: 600-50-300 | 30 days supply | Qty: 30 | Fill #1

## 2020-11-16 DIAGNOSIS — Z79899 Other long term (current) drug therapy: Secondary | ICD-10-CM | POA: Diagnosis not present

## 2020-11-16 DIAGNOSIS — B20 Human immunodeficiency virus [HIV] disease: Secondary | ICD-10-CM | POA: Diagnosis not present

## 2020-11-29 ENCOUNTER — Emergency Department (INDEPENDENT_AMBULATORY_CARE_PROVIDER_SITE_OTHER)
Admission: EM | Admit: 2020-11-29 | Discharge: 2020-11-29 | Disposition: A | Discharge status: Home / Self Care | Payer: 59 | Source: Home / Self Care

## 2020-11-29 ENCOUNTER — Encounter: Payer: Self-pay | Admitting: Emergency Medicine

## 2020-11-29 ENCOUNTER — Other Ambulatory Visit: Payer: Self-pay

## 2020-11-29 DIAGNOSIS — R509 Fever, unspecified: Secondary | ICD-10-CM

## 2020-11-29 DIAGNOSIS — L089 Local infection of the skin and subcutaneous tissue, unspecified: Secondary | ICD-10-CM | POA: Diagnosis not present

## 2020-11-29 DIAGNOSIS — L03115 Cellulitis of right lower limb: Secondary | ICD-10-CM

## 2020-11-29 HISTORY — DX: Human immunodeficiency virus (HIV) disease: B20

## 2020-11-29 MED ORDER — ACETAMINOPHEN 500 MG PO TABS
1000.0000 mg | ORAL_TABLET | Freq: Once | ORAL | Status: AC
  Administered 2020-11-29: 1000 mg via ORAL

## 2020-11-29 MED ORDER — NAPROXEN 375 MG PO TABS: 375.0000 mg | ORAL_TABLET | Freq: Two times a day (BID) | ORAL | 0 refills | Status: DC

## 2020-11-29 MED ORDER — DOXYCYCLINE HYCLATE 100 MG PO CAPS: 100.0000 mg | ORAL_CAPSULE | Freq: Two times a day (BID) | ORAL | 0 refills | Status: DC

## 2020-11-29 MED ORDER — PREDNISONE 20 MG PO TABS: 40.0000 mg | ORAL_TABLET | Freq: Every day | ORAL | 0 refills | Status: DC

## 2020-11-29 NOTE — ED Triage Notes (Signed)
Patient reports having pedicure 2 weeks ago and since then her right large and second toes and been painful with some edema. Has had covid and influenza vaccinations.

## 2020-11-29 NOTE — Discharge Instructions (Signed)
Take Tylenol every 4-6 hours as needed for fever.  Hydrate well with water your heart rate is slightly elevated due to the fever that you are experiencing this can cause dehydration so ensure that you are checking the Tylenol to reduce the fever as well as drinking plenty of fluids to remain hydrated.  [employer]ure antibiotics over and the prednisone.  Start antibiotics tonight and start the prednisone tomorrow.  For pain I have prescribed naproxen you can take this twice daily as needed for pain.  Wear loosefitting shoes to avoid friction involving the great toe.  Return on 12/04/2020 for me to take.  Look at your at your toes to ensure that nothing will need to be drained

## 2020-11-30 DIAGNOSIS — A084 Viral intestinal infection, unspecified: Secondary | ICD-10-CM | POA: Diagnosis not present

## 2020-12-01 NOTE — ED Provider Notes (Signed)
[facility]    CSN: [id number] Arrival date & time: 11/29/20  1333      History   Chief Complaint Chief Complaint  Patient presents with   Toe Pain    HPI Cindy Kirk is a 44 y.o. female.   HPI  Right toe infection gradually worsening over 2 weeks. Toe pain right great toe and second toe.  She reports that she had a pedicure performed and since that time she has had worsening pain surrounding the toenail bed of her first and second great toe, swelling and redness and pain with weightbearing.  Today she awakened with fever and chills and was concerned for possible event infection and presents for evaluation.  Past Medical History:  Diagnosis Date   HIV (human immunodeficiency virus infection) ([place])    Hypertension     Patient Active Problem List   Diagnosis Date Noted   Neuropathic pain 02/01/2017   Obesity 08/13/2015   Human immunodeficiency virus (HIV) disease ([place]) 07/30/2015   Essential (primary) hypertension 09/19/2012   Vitamin D deficiency 09/19/2012    Past Surgical History:  Procedure Laterality Date   cesaren     ECTOPIC PREGNANCY SURGERY      OB History   No obstetric history on file.      Home Medications    Prior to Admission medications   Medication Sig Start Date End Date Taking? Authorizing Provider  abacavir-dolutegravir-lamiVUDine (TRIUMEQ) 600-50-300 MG tablet Take 1 tablet by mouth daily. 10/14/20   Kuppelweiser, Cassie L, RPH-CPP  CARTIA XT 240 MG 24 hr capsule  07/30/18   [provider]  cloNIDine (CATAPRES) 0.1 MG tablet Take 0.1 mg by mouth every evening.    [provider]  diltiazem (DILACOR XR) 240 MG 24 hr capsule Take 240 mg by mouth every morning.    [provider]  doxycycline (VIBRAMYCIN) 100 MG capsule Take 1 capsule (100 mg total) by mouth 2 (two) times daily. 11/29/20   Scot Jun, FNP  gabapentin (NEURONTIN) 100 MG capsule Take 1 capsule (100 mg total) by mouth in  the morning, at noon, and at bedtime. 05/18/20   Rosemarie Ax, MD  lisinopril-hydrochlorothiazide (PRINZIDE,ZESTORETIC) 10-12.5 MG per tablet Take 1 tablet by mouth every morning.    [provider]  naproxen (NAPROSYN) 375 MG tablet Take 1 tablet (375 mg total) by mouth 2 (two) times daily. 11/29/20   Scot Jun, FNP  predniSONE (DELTASONE) 20 MG tablet Take 2 tablets (40 mg total) by mouth daily with breakfast. 11/29/20   Scot Jun, FNP    Family History History reviewed. No pertinent family history.  Social History Social History   Tobacco Use   Smoking status: Never Smoker   Smokeless tobacco: Never Used  Substance Use Topics   Alcohol use: No   Drug use: Not on file     Allergies   Patient has no known allergies.   Review of Systems Review of Systems See HPI  Physical Exam Triage Vital Signs ED Triage Vitals  Enc Vitals Group     BP 11/29/20 1415 (!) 142/102     Pulse Rate 11/29/20 1415 (!) 112     Resp 11/29/20 1415 16     Temp 11/29/20 1415 100.2 F (37.9 C)     Temp Source 11/29/20 1415 Oral     SpO2 11/29/20 1415 97 %     Weight 11/29/20 1416 176 lb (79.8 kg)     Height 11/29/20 1416 5\' 1"  (  1.549 m)     Head Circumference --      Peak Flow --      Pain Score 11/29/20 1416 6     Pain Loc --      Pain Edu? --      Excl. in [place]? --    No data found.  Updated Vital Signs BP (!) 142/102 (BP Location: Right Arm)    Pulse (!) 112    Temp 100.2 F (37.9 C) (Oral)    Resp 16    Ht 5\' 1"  (1.549 m)    Wt 176 lb (79.8 kg)    SpO2 97%    BMI 33.25 kg/m   Visual Acuity Right Eye Distance:   Left Eye Distance:   Bilateral Distance:    Right Eye Near:   Left Eye Near:    Bilateral Near:     Physical Exam   General:   alert, ill appearing, mild distress,  cooperative  Gait:   normal  Skin:   no rash  Oral cavity:   lips, mucosa, and tongue normal; teeth   Eyes:   sclerae white  Nose   No discharge   Ears:    TM normal  bilateral   Neck:   supple, without adenopathy   Lungs:  clear to auscultation bilaterally  Heart:   regular rate and rhythm, no murmur  Abdomen:  soft, non-tender; bowel sounds normal; no masses,  no organomegaly  GU:  deferred  Extremities:   Right foot, streaking present from  Great toe and second toe with moderate edema present great toe and second toe erythematous and diffuse swelling present along with tenderness to touch no oozing or drainage surrounding the nailbed.  Neuro:  normal without focal findings, mental status and  speech normal, reflexes full and symmetric     UC Treatments / Results  Labs (all labs ordered are listed, but only abnormal results are displayed) Labs Reviewed - No data to display  EKG   Radiology No results found.  Procedures Procedures (including critical care time)  Medications Ordered in UC Medications  acetaminophen (TYLENOL) tablet 1,000 mg (1,000 mg Oral Given 11/29/20 1426)    Initial Impression / Assessment and Plan / UC Course  I have reviewed the triage vital signs and the nursing notes.  Pertinent labs & imaging results that were available during my care of the patient were reviewed by me and considered in my medical decision making (see chart for details).    Treating for acute toe infection involving right second toe resulting in cellulitis of the right lower extremity of the foot.  Given fever strict ER precautions as patient also has a condition of HIV positive.  Will prescribe doxycycline twice daily for 10 days, prednisone 40 mg once daily for 5 days and naproxen twice daily as needed for pain.  Advised to monitor for worsening signs of infection she is to return to clinic within 5 days for just a recheck of her foot to ensure infection is resolving.  Patient verbalized understanding and agreement with plan. Final Clinical Impressions(s) / UC Diagnoses   Final diagnoses:  Toe infection, great and second toe  Cellulitis of right  lower extremity foot     Discharge Instructions     Take Tylenol every 4-6 hours as needed for fever.  Hydrate well with water your heart rate is slightly elevated due to the fever that you are experiencing this can cause dehydration so ensure that you are checking  the Tylenol to reduce the fever as well as drinking plenty of fluids to remain hydrated.  Acc[employer]ure antibiotics over and the prednisone.  Start antibiotics tonight and start the prednisone tomorrow.  For pain I have prescribed naproxen you can take this twice daily as needed for pain.  Wear loosefitting shoes to avoid friction involving the great toe.  Return on 12/04/2020 for me to take.  Look at your at your toes to ensure that nothing will need to be drained   ED Prescriptions    Medication Sig Dispense Auth. Provider   predniSONE (DELTASONE) 20 MG tablet Take 2 tablets (40 mg total) by mouth daily with breakfast. 10 tablet Scot Jun, FNP   doxycycline (VIBRAMYCIN) 100 MG capsule Take 1 capsule (100 mg total) by mouth 2 (two) times daily. 20 capsule Scot Jun, FNP   naproxen (NAPROSYN) 375 MG tablet Take 1 tablet (375 mg total) by mouth 2 (two) times daily. 20 tablet Scot Jun, FNP     PDMP not reviewed this encounter.   Scot Jun, FNP 12/05/20 1258

## 2020-12-08 ENCOUNTER — Other Ambulatory Visit: Payer: Self-pay | Admitting: Pharmacist

## 2020-12-08 DIAGNOSIS — B20 Human immunodeficiency virus [HIV] disease: Secondary | ICD-10-CM

## 2020-12-11 ENCOUNTER — Other Ambulatory Visit: Payer: Self-pay | Admitting: Pharmacist

## 2020-12-11 DIAGNOSIS — B20 Human immunodeficiency virus [HIV] disease: Secondary | ICD-10-CM

## 2020-12-11 MED ORDER — TRIUMEQ 600-50-300 MG PO TABS: 1.0000 | ORAL_TABLET | Freq: Every day | ORAL | 9 refills | Status: DC

## 2020-12-11 MED FILL — TRIUMEQ 600-50-300 MG TABS: 600-50-300 | 30 days supply | Qty: 30 | Fill #0

## 2021-01-06 ENCOUNTER — Other Ambulatory Visit (HOSPITAL_BASED_OUTPATIENT_CLINIC_OR_DEPARTMENT_OTHER): Payer: Self-pay | Admitting: Obstetrics and Gynecology

## 2021-01-06 MED FILL — FLUCONAZOLE 150 MG TABS: 150 | 2 days supply | Qty: 2 | Fill #0

## 2021-01-07 MED FILL — TRIUMEQ 600-50-300 MG TABS: 600-50-300 | 30 days supply | Qty: 30 | Fill #1

## 2021-02-02 MED FILL — CARTIA XT 240 MG CAPSULE SA: 240 | 90 days supply | Qty: 90 | Fill #1

## 2021-02-02 MED FILL — LISINOPRIL 10 MG TABS: 10 | 90 days supply | Qty: 90 | Fill #1

## 2021-02-04 MED FILL — TRIUMEQ 600-50-300 MG TABS: 600-50-300 | 30 days supply | Qty: 30 | Fill #2

## 2021-03-23 ENCOUNTER — Other Ambulatory Visit (HOSPITAL_BASED_OUTPATIENT_CLINIC_OR_DEPARTMENT_OTHER): Payer: Self-pay | Admitting: Family Medicine

## 2021-03-23 ENCOUNTER — Other Ambulatory Visit (HOSPITAL_COMMUNITY): Payer: Self-pay

## 2021-03-23 MED FILL — LISINOPRIL 10 MG TABLET: 10 | 90 days supply | Qty: 90 | Fill #0

## 2021-04-01 ENCOUNTER — Other Ambulatory Visit (HOSPITAL_COMMUNITY): Payer: Self-pay

## 2021-04-01 MED FILL — Abacavir-Dolutegravir-Lamivudine Tab 600-50-300 MG: ORAL | 30 days supply | Qty: 30 | Fill #0 | Status: AC

## 2021-04-08 ENCOUNTER — Other Ambulatory Visit (HOSPITAL_COMMUNITY): Payer: Self-pay

## 2021-04-28 ENCOUNTER — Other Ambulatory Visit (HOSPITAL_COMMUNITY): Payer: Self-pay

## 2021-04-28 MED FILL — Abacavir-Dolutegravir-Lamivudine Tab 600-50-300 MG: ORAL | 30 days supply | Qty: 30 | Fill #1 | Status: AC

## 2021-04-29 DIAGNOSIS — M21621 Bunionette of right foot: Secondary | ICD-10-CM | POA: Diagnosis not present

## 2021-04-29 DIAGNOSIS — M2011 Hallux valgus (acquired), right foot: Secondary | ICD-10-CM | POA: Diagnosis not present

## 2021-04-29 DIAGNOSIS — M79671 Pain in right foot: Secondary | ICD-10-CM | POA: Diagnosis not present

## 2021-04-29 DIAGNOSIS — B20 Human immunodeficiency virus [HIV] disease: Secondary | ICD-10-CM | POA: Diagnosis not present

## 2021-04-29 DIAGNOSIS — M2041 Other hammer toe(s) (acquired), right foot: Secondary | ICD-10-CM | POA: Diagnosis not present

## 2021-04-29 DIAGNOSIS — M791 Myalgia, unspecified site: Secondary | ICD-10-CM | POA: Diagnosis not present

## 2021-04-29 DIAGNOSIS — M79672 Pain in left foot: Secondary | ICD-10-CM | POA: Diagnosis not present

## 2021-04-29 DIAGNOSIS — B351 Tinea unguium: Secondary | ICD-10-CM | POA: Diagnosis not present

## 2021-04-29 DIAGNOSIS — L603 Nail dystrophy: Secondary | ICD-10-CM | POA: Diagnosis not present

## 2021-05-04 ENCOUNTER — Other Ambulatory Visit (HOSPITAL_COMMUNITY): Payer: Self-pay

## 2021-05-10 ENCOUNTER — Other Ambulatory Visit (HOSPITAL_COMMUNITY): Payer: Self-pay

## 2021-05-17 DIAGNOSIS — Z7185 Encounter for immunization safety counseling: Secondary | ICD-10-CM | POA: Diagnosis not present

## 2021-05-17 DIAGNOSIS — B20 Human immunodeficiency virus [HIV] disease: Secondary | ICD-10-CM | POA: Diagnosis not present

## 2021-05-20 DIAGNOSIS — B20 Human immunodeficiency virus [HIV] disease: Secondary | ICD-10-CM | POA: Diagnosis not present

## 2021-06-02 ENCOUNTER — Other Ambulatory Visit (HOSPITAL_COMMUNITY): Payer: Self-pay

## 2021-06-07 ENCOUNTER — Other Ambulatory Visit (HOSPITAL_COMMUNITY): Payer: Self-pay

## 2021-06-07 MED FILL — Abacavir-Dolutegravir-Lamivudine Tab 600-50-300 MG: ORAL | 30 days supply | Qty: 30 | Fill #2 | Status: AC

## 2021-06-10 ENCOUNTER — Other Ambulatory Visit (HOSPITAL_COMMUNITY): Payer: Self-pay

## 2021-06-16 ENCOUNTER — Other Ambulatory Visit (HOSPITAL_BASED_OUTPATIENT_CLINIC_OR_DEPARTMENT_OTHER): Payer: Self-pay

## 2021-06-16 MED ORDER — DILTIAZEM HCL ER COATED BEADS 240 MG PO CP24
ORAL_CAPSULE | ORAL | 0 refills | Status: DC
  Filled 2021-06-16: qty 30, 30d supply, fill #0

## 2021-06-17 ENCOUNTER — Other Ambulatory Visit (HOSPITAL_BASED_OUTPATIENT_CLINIC_OR_DEPARTMENT_OTHER): Payer: Self-pay

## 2021-07-05 ENCOUNTER — Other Ambulatory Visit (HOSPITAL_COMMUNITY): Payer: Self-pay

## 2021-07-05 MED FILL — Abacavir-Dolutegravir-Lamivudine Tab 600-50-300 MG: ORAL | 30 days supply | Qty: 30 | Fill #3 | Status: AC

## 2021-07-08 ENCOUNTER — Other Ambulatory Visit (HOSPITAL_BASED_OUTPATIENT_CLINIC_OR_DEPARTMENT_OTHER): Payer: Self-pay

## 2021-07-08 ENCOUNTER — Other Ambulatory Visit (HOSPITAL_COMMUNITY): Payer: Self-pay

## 2021-07-08 MED ORDER — CARESTART COVID-19 HOME TEST VI KIT
PACK | 0 refills | Status: DC
Start: 2021-07-08 — End: 2022-01-29
  Filled 2021-07-08: qty 2, 4d supply, fill #0

## 2021-07-12 ENCOUNTER — Other Ambulatory Visit (HOSPITAL_BASED_OUTPATIENT_CLINIC_OR_DEPARTMENT_OTHER): Payer: Self-pay

## 2021-07-12 DIAGNOSIS — I10 Essential (primary) hypertension: Secondary | ICD-10-CM | POA: Diagnosis not present

## 2021-07-12 DIAGNOSIS — Z0000 Encounter for general adult medical examination without abnormal findings: Secondary | ICD-10-CM | POA: Diagnosis not present

## 2021-07-12 DIAGNOSIS — J329 Chronic sinusitis, unspecified: Secondary | ICD-10-CM | POA: Diagnosis not present

## 2021-07-12 DIAGNOSIS — B20 Human immunodeficiency virus [HIV] disease: Secondary | ICD-10-CM | POA: Diagnosis not present

## 2021-07-12 DIAGNOSIS — J40 Bronchitis, not specified as acute or chronic: Secondary | ICD-10-CM | POA: Diagnosis not present

## 2021-07-12 DIAGNOSIS — R5383 Other fatigue: Secondary | ICD-10-CM | POA: Diagnosis not present

## 2021-07-12 DIAGNOSIS — Z6835 Body mass index (BMI) 35.0-35.9, adult: Secondary | ICD-10-CM | POA: Diagnosis not present

## 2021-07-12 DIAGNOSIS — E559 Vitamin D deficiency, unspecified: Secondary | ICD-10-CM | POA: Diagnosis not present

## 2021-07-12 MED ORDER — SHINGRIX 50 MCG/0.5ML IM SUSR: INTRAMUSCULAR | 1 refills | Status: DC

## 2021-07-23 ENCOUNTER — Other Ambulatory Visit (HOSPITAL_BASED_OUTPATIENT_CLINIC_OR_DEPARTMENT_OTHER): Payer: Self-pay

## 2021-07-23 MED ORDER — DILTIAZEM HCL ER COATED BEADS 240 MG PO CP24
ORAL_CAPSULE | ORAL | 5 refills | Status: DC
  Filled 2021-07-23: qty 30, 30d supply, fill #0
  Filled 2021-08-31: qty 30, 30d supply, fill #1
  Filled 2021-10-18: qty 30, 30d supply, fill #2
  Filled 2021-11-23: qty 30, 30d supply, fill #3
  Filled 2021-12-24: qty 30, 30d supply, fill #4
  Filled 2022-01-27: qty 30, 30d supply, fill #5

## 2021-07-26 ENCOUNTER — Other Ambulatory Visit (HOSPITAL_BASED_OUTPATIENT_CLINIC_OR_DEPARTMENT_OTHER): Payer: Self-pay

## 2021-08-03 ENCOUNTER — Other Ambulatory Visit (HOSPITAL_COMMUNITY): Payer: Self-pay

## 2021-08-05 ENCOUNTER — Other Ambulatory Visit (HOSPITAL_COMMUNITY): Payer: Self-pay

## 2021-08-10 DIAGNOSIS — Z1231 Encounter for screening mammogram for malignant neoplasm of breast: Secondary | ICD-10-CM | POA: Diagnosis not present

## 2021-08-23 ENCOUNTER — Telehealth: Payer: Self-pay | Admitting: Pharmacist

## 2021-08-23 NOTE — Telephone Encounter (Signed)
Called patient to discuss HIV medication, Triumeq for [employer]. No answer, left HIPAA compliant VM.

## 2021-08-26 ENCOUNTER — Other Ambulatory Visit (HOSPITAL_COMMUNITY): Payer: Self-pay

## 2021-08-26 MED FILL — Abacavir-Dolutegravir-Lamivudine Tab 600-50-300 MG: ORAL | 30 days supply | Qty: 30 | Fill #4 | Status: AC

## 2021-08-31 ENCOUNTER — Other Ambulatory Visit (HOSPITAL_BASED_OUTPATIENT_CLINIC_OR_DEPARTMENT_OTHER): Payer: Self-pay

## 2021-09-01 ENCOUNTER — Other Ambulatory Visit (HOSPITAL_COMMUNITY): Payer: Self-pay

## 2021-09-23 ENCOUNTER — Other Ambulatory Visit (HOSPITAL_COMMUNITY): Payer: Self-pay

## 2021-09-23 MED FILL — Abacavir-Dolutegravir-Lamivudine Tab 600-50-300 MG: ORAL | 30 days supply | Qty: 30 | Fill #5 | Status: AC

## 2021-09-27 ENCOUNTER — Other Ambulatory Visit: Payer: Self-pay | Admitting: Family Medicine

## 2021-09-27 ENCOUNTER — Other Ambulatory Visit (HOSPITAL_BASED_OUTPATIENT_CLINIC_OR_DEPARTMENT_OTHER): Payer: Self-pay

## 2021-09-28 ENCOUNTER — Other Ambulatory Visit (HOSPITAL_BASED_OUTPATIENT_CLINIC_OR_DEPARTMENT_OTHER): Payer: Self-pay

## 2021-09-28 ENCOUNTER — Other Ambulatory Visit (HOSPITAL_COMMUNITY): Payer: Self-pay

## 2021-10-05 ENCOUNTER — Other Ambulatory Visit (HOSPITAL_BASED_OUTPATIENT_CLINIC_OR_DEPARTMENT_OTHER): Payer: Self-pay

## 2021-10-18 ENCOUNTER — Other Ambulatory Visit: Payer: Self-pay | Admitting: *Deleted

## 2021-10-18 ENCOUNTER — Other Ambulatory Visit (HOSPITAL_BASED_OUTPATIENT_CLINIC_OR_DEPARTMENT_OTHER): Payer: Self-pay

## 2021-10-18 ENCOUNTER — Telehealth: Payer: Self-pay | Admitting: Family Medicine

## 2021-10-18 MED ORDER — GABAPENTIN 100 MG PO CAPS
100.0000 mg | ORAL_CAPSULE | Freq: Three times a day (TID) | ORAL | 0 refills | Status: DC
  Filled 2021-10-18: qty 90, 30d supply, fill #0

## 2021-10-18 NOTE — Telephone Encounter (Signed)
Refill sent. See meds.  

## 2021-10-18 NOTE — Telephone Encounter (Signed)
Patient stopped by office to request refill on :   gabapentin (NEURONTIN) 100 MG capsule [[id number]]    Order Details Dose: 100 mg Route: Oral Frequency: 3 times daily  Dispense Quantity: 90 capsule Refills: 0        Sig: Take 1 capsule (100 mg total) by mouth in the morning, at noon, and at bedtime.         ---Pt uses :   Pharmacy  [facility], [place] - [address], [place] [address]  Phone:  [phone]  Fax:  [phone]   --Dion Body

## 2021-10-20 ENCOUNTER — Other Ambulatory Visit (HOSPITAL_COMMUNITY): Payer: Self-pay

## 2021-10-22 ENCOUNTER — Other Ambulatory Visit (HOSPITAL_COMMUNITY): Payer: Self-pay

## 2021-10-22 MED ORDER — TRIUMEQ 600-50-300 MG PO TABS: 1.0000 | ORAL_TABLET | Freq: Every day | ORAL | 9 refills | Status: DC

## 2021-11-04 ENCOUNTER — Other Ambulatory Visit (HOSPITAL_COMMUNITY): Payer: Self-pay

## 2021-11-08 ENCOUNTER — Other Ambulatory Visit (HOSPITAL_COMMUNITY): Payer: Self-pay

## 2021-11-22 ENCOUNTER — Other Ambulatory Visit: Payer: Self-pay

## 2021-11-22 ENCOUNTER — Other Ambulatory Visit (HOSPITAL_COMMUNITY): Payer: Self-pay

## 2021-11-22 ENCOUNTER — Ambulatory Visit (INDEPENDENT_AMBULATORY_CARE_PROVIDER_SITE_OTHER): Payer: 59 | Admitting: Pharmacist

## 2021-11-22 DIAGNOSIS — B20 Human immunodeficiency virus [HIV] disease: Secondary | ICD-10-CM | POA: Diagnosis not present

## 2021-11-22 DIAGNOSIS — Z79899 Other long term (current) drug therapy: Secondary | ICD-10-CM | POA: Diagnosis not present

## 2021-11-22 DIAGNOSIS — Z7185 Encounter for immunization safety counseling: Secondary | ICD-10-CM | POA: Diagnosis not present

## 2021-11-22 DIAGNOSIS — I10 Essential (primary) hypertension: Secondary | ICD-10-CM | POA: Diagnosis not present

## 2021-11-22 MED ORDER — TRIUMEQ 600-50-300 MG PO TABS
1.0000 | ORAL_TABLET | Freq: Every day | ORAL | 9 refills | Status: DC
  Filled 2021-11-22: qty 30, 30d supply, fill #0
  Filled 2021-12-14: qty 30, 30d supply, fill #1
  Filled 2022-01-12: qty 30, 30d supply, fill #2
  Filled 2022-02-07: qty 30, 30d supply, fill #3
  Filled 2022-03-18: qty 30, 30d supply, fill #4
  Filled 2022-04-12: qty 30, 30d supply, fill #5
  Filled 2022-05-10: qty 30, 30d supply, fill #6
  Filled 2022-06-06: qty 30, 30d supply, fill #7
  Filled 2022-07-05: qty 30, 30d supply, fill #8
  Filled 2022-08-05: qty 30, 30d supply, fill #9

## 2021-11-22 NOTE — Progress Notes (Signed)
Virtual Visit via Telephone Note  I connected with [name] on 11/22/21 at  2:00 PM EST by telephone and verified that I am speaking with the correct person using two identifiers.  Location: Patient: Home Provider: Office   I discussed the limitations, risks, security and privacy concerns of performing an evaluation and management service by telephone and the availability of in person appointments. I also discussed with the patient that there may be a patient responsible charge related to this service. The patient expressed understanding and agreed to proceed.  HPI: Cindy Kirk is a 45 y.o. female who presents for follow-up of their long-term specialty medication, Triumeq.  Patient Active Problem List   Diagnosis Date Noted   Neuropathic pain 02/01/2017   Obesity 08/13/2015   Human immunodeficiency virus (HIV) disease ([place]) 07/30/2015   Essential (primary) hypertension 09/19/2012   Vitamin D deficiency 09/19/2012    Patient's Medications  New Prescriptions   No medications on file  Previous Medications   CARTIA XT 240 MG 24 HR CAPSULE       CLONIDINE (CATAPRES) 0.1 MG TABLET    Take 0.1 mg by mouth every evening.   COVID-19 AT HOME ANTIGEN TEST (CARESTART COVID-19 HOME TEST) KIT    Use as directed   DILTIAZEM (CARTIA XT) 240 MG 24 HR CAPSULE    TAKE 1 CAPSULE BY MOUTH ONCE DAILY FOR BLOOD PRESSURE   DILTIAZEM (DILACOR XR) 240 MG 24 HR CAPSULE    Take 240 mg by mouth every morning.   DOXYCYCLINE (VIBRAMYCIN) 100 MG CAPSULE    Take 1 capsule (100 mg total) by mouth 2 (two) times daily.   FLUCONAZOLE (DIFLUCAN) 150 MG TABLET    TAKE 1 TABLET BY MOUTH ONCE; MAY REPEAT IN 3 DAYS IF NEEDED.   GABAPENTIN (NEURONTIN) 100 MG CAPSULE    Take 1 capsule (100 mg total) by mouth in the morning, at noon, and at bedtime.   LISINOPRIL (ZESTRIL) 10 MG TABLET    TAKE ONE TABLET (10 MG DOSE) BY MOUTH DAILY.   LISINOPRIL (ZESTRIL) 10 MG TABLET    TAKE ONE TABLET (10 MG DOSE) BY MOUTH DAILY.    LISINOPRIL-HYDROCHLOROTHIAZIDE (PRINZIDE,ZESTORETIC) 10-12.5 MG PER TABLET    Take 1 tablet by mouth every morning.   NAPROXEN (NAPROSYN) 375 MG TABLET    Take 1 tablet (375 mg total) by mouth 2 (two) times daily.   PREDNISONE (DELTASONE) 20 MG TABLET    Take 2 tablets (40 mg total) by mouth daily with breakfast.   ZOSTER VACCINE ADJUVANTED (SHINGRIX) INJECTION    Inject 0.5 mLs into the muscle once for 1 dose.  Modified Medications   Modified Medication Previous Medication   ABACAVIR-DOLUTEGRAVIR-LAMIVUDINE (TRIUMEQ) 600-50-300 MG TABLET abacavir-dolutegravir-lamiVUDine (TRIUMEQ) 600-50-300 MG tablet      Take 1 tablet by mouth daily.    TAKE 1 TABLET BY MOUTH DAILY.  Discontinued Medications   No medications on file    Allergies: No Known Allergies  Past Medical History: Past Medical History:  Diagnosis Date   HIV (human immunodeficiency virus infection) ([place])    Hypertension     Social History: Social History   Socioeconomic History   Marital status: Single    Spouse name: Not on file   Number of children: Not on file   Years of education: Not on file   Highest education level: Not on file  Occupational History   Not on file  Tobacco Use   Smoking status: Never   Smokeless tobacco: Never  Substance and Sexual Activity   Alcohol use: No   Drug use: Not on file   Sexual activity: Not on file  Other Topics Concern   Not on file  Social History Narrative   Not on file   Social Determinants of Health   Financial Resource Strain: Not on file  Food Insecurity: Not on file  Transportation Needs: Not on file  Physical Activity: Not on file  Stress: Not on file  Social Connections: Not on file    Labs: No results found for: HIV1RNAQUANT, HIV1RNAVL, CD4TABS  RPR and STI No results found for: LABRPR, RPRTITER  No flowsheet data found.  Hepatitis B No results found for: HEPBSAB, HEPBSAG, HEPBCAB Hepatitis C No results found for: HEPCAB,  HCVRNAPCRQN Hepatitis A No results found for: HAV Lipids: No results found for: CHOL, TRIG, HDL, CHOLHDL, VLDL, LDLCALC  Assessment: I spoke with [place] today regarding their specialty medication, Triumeq. Patient takes it every day without any issues or missed doses. No problems with adverse effects or tolerability. No problems getting it from [facility]. Updated/reviewed medication list - no drug interactions. All questions answered. Will follow up in 1 year.  Plan: - Continue Triumeq - Follow up in 1 year   I discussed the assessment and treatment plan with the patient. The patient was provided an opportunity to ask questions and all were answered. The patient agreed with the plan and demonstrated an understanding of the instructions.   The patient was advised to call back or seek an in-person evaluation if the symptoms worsen or if the condition fails to improve as anticipated.  I provided 5 minutes of non-face-to-face time during this encounter.  Tora Prunty L. Shaely Gadberry, PharmD, BCIDP, [place], CPP Clinical Pharmacist Practitioner Infectious Diseases [facility] 11/22/2021, 2:32 PM

## 2021-11-23 ENCOUNTER — Other Ambulatory Visit (HOSPITAL_BASED_OUTPATIENT_CLINIC_OR_DEPARTMENT_OTHER): Payer: Self-pay

## 2021-11-29 ENCOUNTER — Other Ambulatory Visit (HOSPITAL_BASED_OUTPATIENT_CLINIC_OR_DEPARTMENT_OTHER): Payer: Self-pay

## 2021-12-03 DIAGNOSIS — M25562 Pain in left knee: Secondary | ICD-10-CM | POA: Diagnosis not present

## 2021-12-09 ENCOUNTER — Other Ambulatory Visit (HOSPITAL_COMMUNITY): Payer: Self-pay

## 2021-12-14 ENCOUNTER — Other Ambulatory Visit (HOSPITAL_COMMUNITY): Payer: Self-pay

## 2021-12-14 ENCOUNTER — Emergency Department (HOSPITAL_BASED_OUTPATIENT_CLINIC_OR_DEPARTMENT_OTHER): Payer: 59

## 2021-12-14 ENCOUNTER — Encounter (HOSPITAL_BASED_OUTPATIENT_CLINIC_OR_DEPARTMENT_OTHER): Payer: Self-pay

## 2021-12-14 ENCOUNTER — Emergency Department (HOSPITAL_BASED_OUTPATIENT_CLINIC_OR_DEPARTMENT_OTHER)
Admission: EM | Admit: 2021-12-14 | Discharge: 2021-12-14 | Disposition: A | Discharge status: Home / Self Care | Payer: 59 | Attending: Emergency Medicine | Admitting: Emergency Medicine

## 2021-12-14 ENCOUNTER — Other Ambulatory Visit: Payer: Self-pay

## 2021-12-14 DIAGNOSIS — I10 Essential (primary) hypertension: Secondary | ICD-10-CM | POA: Diagnosis not present

## 2021-12-14 DIAGNOSIS — Z21 Asymptomatic human immunodeficiency virus [HIV] infection status: Secondary | ICD-10-CM | POA: Diagnosis not present

## 2021-12-14 DIAGNOSIS — M25562 Pain in left knee: Secondary | ICD-10-CM | POA: Insufficient documentation

## 2021-12-14 DIAGNOSIS — S8992XA Unspecified injury of left lower leg, initial encounter: Secondary | ICD-10-CM | POA: Diagnosis not present

## 2021-12-14 DIAGNOSIS — Z79899 Other long term (current) drug therapy: Secondary | ICD-10-CM | POA: Diagnosis not present

## 2021-12-14 MED ORDER — DEXAMETHASONE SODIUM PHOSPHATE 10 MG/ML IJ SOLN
10.0000 mg | Freq: Once | INTRAMUSCULAR | Status: AC
  Administered 2021-12-14: 17:00:00 10 mg via INTRAMUSCULAR
  Filled 2021-12-14: qty 1

## 2021-12-14 NOTE — Discharge Instructions (Addendum)
Please use Tylenol or ibuprofen for pain.  You may use 600 mg ibuprofen every 6 hours or 1000 mg of Tylenol every 6 hours.  You may choose to alternate between the 2.  This would be most effective.  Not to exceed 4 g of Tylenol within 24 hours.  Not to exceed 3200 mg ibuprofen 24 hours.  As we discussed based on the testing that I was performing in your knee I have some concern that you may have an injury to your meniscus.  I recommend that you follow-up with orthopedics at your earliest convenience to discuss further evaluation and see whether or not you may need surgery on this joint.  They will likely have to perform further imaging so that they can assess the state of the meniscus, and knee joint.

## 2021-12-14 NOTE — ED Triage Notes (Signed)
Pt c/o pain to left knee ~2 weeks ago after sliding out of bed-was seen by PCP 2 weeks ago-rx meloxicam/no relief-states she has brace on knee-NAD-limping gait

## 2021-12-14 NOTE — ED Notes (Signed)
E-SIG PAD NOT WORKING IN TRIAGE-PT VERBALLY AGREED MSE STATEMENT

## 2021-12-14 NOTE — ED Provider Notes (Signed)
[facility] Provider Note   CSN: [id number] Arrival date & time: 12/14/21  1533     History Chief Complaint  Patient presents with   Knee Pain    Cindy Kirk is a 45 y.o. female with no significant past medical history presents with around 2 weeks left-sided knee pain secondary to an injury where she slid out of bed onto her knees.  Patient reports that pain of the left knee has been worsening since the initial injury.  Patient reports that she is trying meloxicam which she was prescribed by her primary care with minimal to no improvement.  Patient reports no pain at rest, 9/10 pain with walking, or bending of the knee.  Patient denies previous surgery of the knee.  Patient has not tried Tylenol, Voltaren gel, or any other topical treatments.  Patient has not seen an orthopedic doctor.  Patient describes the pain as sharp in nature.  Denies any numbness or tingling of the affected limb.   Knee Pain     Past Medical History:  Diagnosis Date   HIV (human immunodeficiency virus infection) ([place])    Hypertension     Patient Active Problem List   Diagnosis Date Noted   Neuropathic pain 02/01/2017   Obesity 08/13/2015   Human immunodeficiency virus (HIV) disease ([place]) 07/30/2015   Essential (primary) hypertension 09/19/2012   Vitamin D deficiency 09/19/2012    Past Surgical History:  Procedure Laterality Date   cesaren     ECTOPIC PREGNANCY SURGERY       OB History   No obstetric history on file.     No family history on file.  Social History   Tobacco Use   Smoking status: Never   Smokeless tobacco: Never  Vaping Use   Vaping Use: Never used  Substance Use Topics   Alcohol use: No   Drug use: Never    Home Medications Prior to Admission medications   Medication Sig Start Date End Date Taking? Authorizing Provider  abacavir-dolutegravir-lamiVUDine (TRIUMEQ) 600-50-300 MG tablet Take 1 tablet by mouth daily. 11/22/21    Kuppelweiser, Cassie L, RPH-CPP  CARTIA XT 240 MG 24 hr capsule  07/30/18   [provider]  cloNIDine (CATAPRES) 0.1 MG tablet Take 0.1 mg by mouth every evening.    [provider]  COVID-19 At Home Antigen Test [facility] COVID-19 HOME TEST) KIT Use as directed 07/08/21   Clementeen Graham, RPH  diltiazem (CARTIA XT) 240 MG 24 hr capsule TAKE 1 CAPSULE BY MOUTH ONCE DAILY FOR BLOOD PRESSURE 07/23/21     diltiazem (DILACOR XR) 240 MG 24 hr capsule Take 240 mg by mouth every morning.    [provider]  doxycycline (VIBRAMYCIN) 100 MG capsule Take 1 capsule (100 mg total) by mouth 2 (two) times daily. 11/29/20   Scot Jun, FNP  fluconazole (DIFLUCAN) 150 MG tablet TAKE 1 TABLET BY MOUTH ONCE; MAY REPEAT IN 3 DAYS IF NEEDED. 01/06/21 01/06/22  Despina Pole, MD  gabapentin (NEURONTIN) 100 MG capsule Take 1 capsule (100 mg total) by mouth in the morning, at noon, and at bedtime. 10/18/21   Rosemarie Ax, MD  lisinopril (ZESTRIL) 10 MG tablet TAKE ONE TABLET (10 MG DOSE) BY MOUTH DAILY. 03/23/21 03/23/22  Lowry Bowl, MD  lisinopril (ZESTRIL) 10 MG tablet TAKE ONE TABLET (10 MG DOSE) BY MOUTH DAILY. 10/01/20 10/01/21  Lowry Bowl, MD  lisinopril-hydrochlorothiazide (PRINZIDE,ZESTORETIC) 10-12.5 MG per tablet Take 1 tablet by mouth  every morning.    [provider]  naproxen (NAPROSYN) 375 MG tablet Take 1 tablet (375 mg total) by mouth 2 (two) times daily. 11/29/20   Scot Jun, FNP  predniSONE (DELTASONE) 20 MG tablet Take 2 tablets (40 mg total) by mouth daily with breakfast. 11/29/20   Scot Jun, FNP  Zoster Vaccine Adjuvanted [facility]) injection Inject 0.5 mLs into the muscle once for 1 dose. 07/12/21       Allergies    Patient has no known allergies.  Review of Systems   Review of Systems  Musculoskeletal:  Positive for gait problem and joint swelling.  All other systems reviewed and are negative.  Physical  Exam Updated Vital Signs BP (!) 139/92 (BP Location: Left Arm)    Pulse 89    Temp 98 F (36.7 C) (Oral)    Resp 16    Ht '5\' 1"'  (1.549 m)    Wt 83.9 kg    LMP 11/30/2021    SpO2 100%    BMI 34.96 kg/m   Physical Exam Vitals and nursing note reviewed.  Constitutional:      General: She is not in acute distress.    Appearance: Normal appearance.  HENT:     Head: Normocephalic and atraumatic.  Eyes:     General:        Right eye: No discharge.        Left eye: No discharge.  Cardiovascular:     Rate and Rhythm: Normal rate and regular rhythm.     Pulses: Normal pulses.     Comments: Intact DP, PT pulses bilaterally Pulmonary:     Effort: Pulmonary effort is normal. No respiratory distress.  Musculoskeletal:        General: No deformity.     Comments: Intact strength 5 out of 5 to flexion extension of the left knee.  There is no evidence of laxity with varus, valgus stress, anterior, posterior drawer stress.  Patient does have significant pain, crepitus with McMurray's.  Intact gait of the affected limb with some pain.  There is no significant swelling or evident effusion of the left knee.  Skin:    General: Skin is warm and dry.     Capillary Refill: Capillary refill takes less than 2 seconds.  Neurological:     Mental Status: She is alert and oriented to person, place, and time.  Psychiatric:        Mood and Affect: Mood normal.        Behavior: Behavior normal.    ED Results / Procedures / Treatments   Labs (all labs ordered are listed, but only abnormal results are displayed) Labs Reviewed - No data to display  EKG None  Radiology DG Knee Complete 4 Views Left  Result Date: 12/14/2021 CLINICAL DATA:  Left knee injury EXAM: LEFT KNEE - COMPLETE 4+ VIEW COMPARISON:  None. FINDINGS: No acute fracture or dislocation identified. Mild narrowing of the patellofemoral joint and lateral compartment. Moderate narrowing of the medial compartment joint space. Tricompartmental  osteophytes. No significant joint effusion visualized. IMPRESSION: Degenerative changes with no acute osseous abnormality identified. Electronically Signed   By: Ofilia Neas M.D.   On: 12/14/2021 16:14    Procedures Procedures   Medications Ordered in ED Medications  dexamethasone (DECADRON) injection 10 mg (has no administration in time range)    ED Course  I have reviewed the triage vital signs and the nursing notes.  Pertinent labs & imaging results that were available  during my care of the patient were reviewed by me and considered in my medical decision making (see chart for details).    MDM Rules/Calculators/A&P                         Overall well-appearing patient with neurovascularly intact left leg presents with significant knee pain for the last 2 weeks.  Patient has not had resolution of pain with meloxicam twice daily as she was prescribed by her primary care.  Patient has not tried any other medication or topical medication.  Physical exam does not reveal any evidence of collateral ligament tear or injury, radiographic imaging does not reveal any evidence of acute fracture, or dislocation.  Radiographic imaging does show evidence of arthritis degeneration of the knee.  Patient with positive McMurray's test suggesting dysfunction or acute injury of the meniscus.  Discussed I recommend follow-up with orthopedics for further imaging of the knee and discussion of possible surgery if pain persists.  Given patient's significant pain, and failure of nonsteroidal anti-inflammatories at this time we will administer intramuscular Decadron to help with inflammation and swelling.  Patient discharged in stable condition at this time, encouraged to continue scheduled ibuprofen, Tylenol.  Return precautions given. Final Clinical Impression(s) / ED Diagnoses Final diagnoses:  Acute pain of left knee    Rx / DC Orders ED Discharge Orders     None        Anselmo Pickler,  PA-C 12/14/21 1659    Jeanell Sparrow, DO 12/15/21 0025

## 2021-12-15 DIAGNOSIS — M1712 Unilateral primary osteoarthritis, left knee: Secondary | ICD-10-CM | POA: Diagnosis not present

## 2021-12-15 DIAGNOSIS — M25562 Pain in left knee: Secondary | ICD-10-CM | POA: Diagnosis not present

## 2021-12-16 ENCOUNTER — Other Ambulatory Visit (HOSPITAL_COMMUNITY): Payer: Self-pay

## 2021-12-24 ENCOUNTER — Other Ambulatory Visit (HOSPITAL_BASED_OUTPATIENT_CLINIC_OR_DEPARTMENT_OTHER): Payer: Self-pay

## 2022-01-12 ENCOUNTER — Other Ambulatory Visit (HOSPITAL_COMMUNITY): Payer: Self-pay

## 2022-01-13 ENCOUNTER — Other Ambulatory Visit (HOSPITAL_COMMUNITY): Payer: Self-pay

## 2022-01-14 DIAGNOSIS — M1712 Unilateral primary osteoarthritis, left knee: Secondary | ICD-10-CM | POA: Diagnosis not present

## 2022-01-27 ENCOUNTER — Other Ambulatory Visit (HOSPITAL_BASED_OUTPATIENT_CLINIC_OR_DEPARTMENT_OTHER): Payer: Self-pay

## 2022-01-29 ENCOUNTER — Other Ambulatory Visit: Payer: Self-pay

## 2022-01-29 ENCOUNTER — Emergency Department
Admission: EM | Admit: 2022-01-29 | Discharge: 2022-01-29 | Disposition: A | Discharge status: Home / Self Care | Payer: 59 | Source: Home / Self Care | Attending: Family Medicine | Admitting: Family Medicine

## 2022-01-29 DIAGNOSIS — I10 Essential (primary) hypertension: Secondary | ICD-10-CM | POA: Diagnosis not present

## 2022-01-29 DIAGNOSIS — H1132 Conjunctival hemorrhage, left eye: Secondary | ICD-10-CM

## 2022-01-29 NOTE — ED Triage Notes (Signed)
Pt states that she woke up with redness of left eye. X1 day  Pt states that she also feels some fatigue. Pt states that she had a friend to check her blood pressure was 178/115 for the first reading and 180/120 for the second reading.

## 2022-01-29 NOTE — Discharge Instructions (Signed)
Take your blood pressure medicine every day Follow a low-salt healthy diet See your primary care doctor for follow-up

## 2022-01-29 NOTE — ED Provider Notes (Signed)
[facility]    CSN: [id number] Arrival date & time: 01/29/22  0957      History   Chief Complaint Chief Complaint  Patient presents with   Eye Problem    Left eye is red. X1 day   Hypertension    High blood pressure x1 day    HPI Cindy Kirk is a 46 y.o. female.   HPI  Patient has known hypertension.  She states that she woke up this morning with redness in her eye.  She does not remember any trauma.  She does not have any itching, discharge, or change in vision.  She had her blood pressure checked, and it was quite high.  See nurses note.  Highest number was 180/120.  No headache or dizziness.  No blurred vision.  No numbness or weakness in extremities.  No cognitive change or stroke symptoms.  Past Medical History:  Diagnosis Date   HIV (human immunodeficiency virus infection) ([place])    Hypertension     Patient Active Problem List   Diagnosis Date Noted   Neuropathic pain 02/01/2017   Obesity 08/13/2015   Human immunodeficiency virus (HIV) disease ([place]) 07/30/2015   Essential (primary) hypertension 09/19/2012   Vitamin D deficiency 09/19/2012    Past Surgical History:  Procedure Laterality Date   cesaren     ECTOPIC PREGNANCY SURGERY      OB History   No obstetric history on file.      Home Medications    Prior to Admission medications   Medication Sig Start Date End Date Taking? Authorizing Provider  diltiazem (CARTIA XT) 240 MG 24 hr capsule TAKE 1 CAPSULE BY MOUTH ONCE DAILY FOR BLOOD PRESSURE 07/23/21  Yes   abacavir-dolutegravir-lamiVUDine (TRIUMEQ) 517-00-174 MG tablet Take 1 tablet by mouth daily. 11/22/21   Kuppelweiser, Cassie L, RPH-CPP    Family History History reviewed. No pertinent family history.  Social History Social History   Tobacco Use   Smoking status: Never   Smokeless tobacco: Never  Vaping Use   Vaping Use: Never used  Substance Use Topics   Alcohol use: Yes    Comment: occ   Drug use: Never      Allergies   Patient has no known allergies.   Review of Systems Review of Systems  See HPI Physical Exam Triage Vital Signs ED Triage Vitals  Enc Vitals Group     BP 01/29/22 1019 (!) 140/93     Pulse Rate 01/29/22 1019 87     Resp 01/29/22 1019 18     Temp 01/29/22 1019 98.3 F (36.8 C)     Temp Source 01/29/22 1019 Oral     SpO2 01/29/22 1019 98 %     Weight 01/29/22 1015 185 lb (83.9 kg)     Height 01/29/22 1015 5\' 1"  (1.549 m)     Head Circumference --      Peak Flow --      Pain Score 01/29/22 1015 4     Pain Loc --      Pain Edu? --      Excl. in [place]? --    No data found.  Updated Vital Signs BP (!) 140/93 (BP Location: Left Arm)    Pulse 87    Temp 98.3 F (36.8 C) (Oral)    Resp 18    Ht 5\' 1"  (1.549 m)    Wt 83.9 kg    LMP 01/29/2022    SpO2 98%    BMI 34.96 kg/m  Physical Exam Constitutional:      General: She is not in acute distress.    Appearance: She is well-developed.  HENT:     Head: Normocephalic and atraumatic.     Mouth/Throat:     Comments: Mask is in place Eyes:     Conjunctiva/sclera: Conjunctivae normal.     Pupils: Pupils are equal, round, and reactive to light.      Comments: Small subconjunctival hemorrhage left eye  Cardiovascular:     Rate and Rhythm: Normal rate and regular rhythm.     Heart sounds: Normal heart sounds.  Pulmonary:     Effort: Pulmonary effort is normal. No respiratory distress.     Breath sounds: Normal breath sounds.  Abdominal:     General: There is no distension.     Palpations: Abdomen is soft.  Musculoskeletal:        General: Normal range of motion.     Cervical back: Normal range of motion.  Skin:    General: Skin is warm and dry.  Neurological:     Mental Status: She is alert.     UC Treatments / Results  Labs (all labs ordered are listed, but only abnormal results are displayed) Labs Reviewed - No data to display  EKG   Radiology No results found.  Procedures Procedures  (including critical care time)  Medications Ordered in UC Medications - No data to display  Initial Impression / Assessment and Plan / UC Course  I have reviewed the triage vital signs and the nursing notes.  Pertinent labs & imaging results that were available during my care of the patient were reviewed by me and considered in my medical decision making (see chart for details).     Blood pressures notDangerously elevated by our reading.  She currently feels well.  Heart and lung exam is normal.  Follow-up with PCP Reassured that the subconjunctival hemorrhage will go away spontaneously.  Can use a liquid tears eyedrop if there is discomfort Final Clinical Impressions(s) / UC Diagnoses   Final diagnoses:  Primary hypertension  Subconjunctival hemorrhage of left eye     Discharge Instructions      Take your blood pressure medicine every day Follow a low-salt healthy diet See your primary care doctor for follow-up   ED Prescriptions   None    PDMP not reviewed this encounter.   Raylene Everts, MD 01/29/22 1116

## 2022-02-07 ENCOUNTER — Other Ambulatory Visit (HOSPITAL_COMMUNITY): Payer: Self-pay

## 2022-02-09 ENCOUNTER — Other Ambulatory Visit (HOSPITAL_COMMUNITY): Payer: Self-pay

## 2022-03-03 ENCOUNTER — Other Ambulatory Visit (HOSPITAL_BASED_OUTPATIENT_CLINIC_OR_DEPARTMENT_OTHER): Payer: Self-pay

## 2022-03-03 MED ORDER — DILTIAZEM HCL ER COATED BEADS 240 MG PO CP24
ORAL_CAPSULE | ORAL | 0 refills | Status: DC
  Filled 2022-03-03: qty 30, 30d supply, fill #0

## 2022-03-07 ENCOUNTER — Other Ambulatory Visit (HOSPITAL_COMMUNITY): Payer: Self-pay

## 2022-03-18 ENCOUNTER — Other Ambulatory Visit (HOSPITAL_COMMUNITY): Payer: Self-pay

## 2022-03-21 ENCOUNTER — Other Ambulatory Visit (HOSPITAL_COMMUNITY): Payer: Self-pay

## 2022-03-29 ENCOUNTER — Other Ambulatory Visit (HOSPITAL_BASED_OUTPATIENT_CLINIC_OR_DEPARTMENT_OTHER): Payer: Self-pay

## 2022-03-29 DIAGNOSIS — Z23 Encounter for immunization: Secondary | ICD-10-CM | POA: Diagnosis not present

## 2022-03-29 DIAGNOSIS — B20 Human immunodeficiency virus [HIV] disease: Secondary | ICD-10-CM | POA: Diagnosis not present

## 2022-03-29 DIAGNOSIS — I10 Essential (primary) hypertension: Secondary | ICD-10-CM | POA: Diagnosis not present

## 2022-03-29 DIAGNOSIS — R635 Abnormal weight gain: Secondary | ICD-10-CM | POA: Diagnosis not present

## 2022-03-29 DIAGNOSIS — R0683 Snoring: Secondary | ICD-10-CM | POA: Diagnosis not present

## 2022-03-29 DIAGNOSIS — E559 Vitamin D deficiency, unspecified: Secondary | ICD-10-CM | POA: Diagnosis not present

## 2022-03-29 DIAGNOSIS — Z6835 Body mass index (BMI) 35.0-35.9, adult: Secondary | ICD-10-CM | POA: Diagnosis not present

## 2022-03-29 MED ORDER — LOSARTAN POTASSIUM 25 MG PO TABS
25.0000 mg | ORAL_TABLET | Freq: Every day | ORAL | 1 refills | Status: DC
  Filled 2022-03-29: qty 30, 30d supply, fill #0
  Filled 2022-04-28: qty 30, 30d supply, fill #1

## 2022-04-01 ENCOUNTER — Other Ambulatory Visit (HOSPITAL_BASED_OUTPATIENT_CLINIC_OR_DEPARTMENT_OTHER): Payer: Self-pay

## 2022-04-04 ENCOUNTER — Other Ambulatory Visit (HOSPITAL_BASED_OUTPATIENT_CLINIC_OR_DEPARTMENT_OTHER): Payer: Self-pay

## 2022-04-04 MED ORDER — DILTIAZEM HCL ER COATED BEADS 240 MG PO CP24
ORAL_CAPSULE | ORAL | 0 refills | Status: DC
  Filled 2022-04-04: qty 30, 30d supply, fill #0

## 2022-04-05 ENCOUNTER — Other Ambulatory Visit (HOSPITAL_COMMUNITY): Payer: Self-pay

## 2022-04-07 ENCOUNTER — Other Ambulatory Visit (HOSPITAL_BASED_OUTPATIENT_CLINIC_OR_DEPARTMENT_OTHER): Payer: Self-pay

## 2022-04-12 ENCOUNTER — Other Ambulatory Visit (HOSPITAL_COMMUNITY): Payer: Self-pay

## 2022-04-18 ENCOUNTER — Other Ambulatory Visit (HOSPITAL_COMMUNITY): Payer: Self-pay

## 2022-04-18 DIAGNOSIS — M1712 Unilateral primary osteoarthritis, left knee: Secondary | ICD-10-CM | POA: Diagnosis not present

## 2022-04-19 DIAGNOSIS — N939 Abnormal uterine and vaginal bleeding, unspecified: Secondary | ICD-10-CM | POA: Diagnosis not present

## 2022-04-19 DIAGNOSIS — Z01419 Encounter for gynecological examination (general) (routine) without abnormal findings: Secondary | ICD-10-CM | POA: Diagnosis not present

## 2022-04-25 DIAGNOSIS — M1712 Unilateral primary osteoarthritis, left knee: Secondary | ICD-10-CM | POA: Diagnosis not present

## 2022-04-28 ENCOUNTER — Other Ambulatory Visit (HOSPITAL_BASED_OUTPATIENT_CLINIC_OR_DEPARTMENT_OTHER): Payer: Self-pay

## 2022-05-02 ENCOUNTER — Other Ambulatory Visit (HOSPITAL_BASED_OUTPATIENT_CLINIC_OR_DEPARTMENT_OTHER): Payer: Self-pay

## 2022-05-02 DIAGNOSIS — M1712 Unilateral primary osteoarthritis, left knee: Secondary | ICD-10-CM | POA: Diagnosis not present

## 2022-05-02 MED ORDER — DILTIAZEM HCL ER COATED BEADS 240 MG PO CP24
ORAL_CAPSULE | ORAL | 1 refills | Status: DC
  Filled 2022-05-02: qty 30, 30d supply, fill #0
  Filled 2022-06-02: qty 30, 30d supply, fill #1
  Filled 2022-07-04: qty 30, 30d supply, fill #2
  Filled 2022-08-04: qty 30, 30d supply, fill #3
  Filled 2022-09-05: qty 30, 30d supply, fill #4
  Filled 2022-10-04: qty 30, 30d supply, fill #5

## 2022-05-10 ENCOUNTER — Other Ambulatory Visit (HOSPITAL_COMMUNITY): Payer: Self-pay

## 2022-05-10 DIAGNOSIS — Z01818 Encounter for other preprocedural examination: Secondary | ICD-10-CM | POA: Diagnosis not present

## 2022-05-13 DIAGNOSIS — F419 Anxiety disorder, unspecified: Secondary | ICD-10-CM | POA: Diagnosis not present

## 2022-05-13 DIAGNOSIS — Z21 Asymptomatic human immunodeficiency virus [HIV] infection status: Secondary | ICD-10-CM | POA: Diagnosis not present

## 2022-05-13 DIAGNOSIS — I10 Essential (primary) hypertension: Secondary | ICD-10-CM | POA: Diagnosis not present

## 2022-05-13 DIAGNOSIS — Z79899 Other long term (current) drug therapy: Secondary | ICD-10-CM | POA: Diagnosis not present

## 2022-05-13 DIAGNOSIS — N939 Abnormal uterine and vaginal bleeding, unspecified: Secondary | ICD-10-CM | POA: Diagnosis not present

## 2022-05-13 DIAGNOSIS — N938 Other specified abnormal uterine and vaginal bleeding: Secondary | ICD-10-CM | POA: Diagnosis not present

## 2022-05-13 DIAGNOSIS — N882 Stricture and stenosis of cervix uteri: Secondary | ICD-10-CM | POA: Diagnosis not present

## 2022-05-13 DIAGNOSIS — Z888 Allergy status to other drugs, medicaments and biological substances status: Secondary | ICD-10-CM | POA: Diagnosis not present

## 2022-05-16 ENCOUNTER — Other Ambulatory Visit (HOSPITAL_COMMUNITY): Payer: Self-pay

## 2022-05-25 DIAGNOSIS — Z48816 Encounter for surgical aftercare following surgery on the genitourinary system: Secondary | ICD-10-CM | POA: Diagnosis not present

## 2022-05-30 ENCOUNTER — Other Ambulatory Visit (HOSPITAL_BASED_OUTPATIENT_CLINIC_OR_DEPARTMENT_OTHER): Payer: Self-pay

## 2022-05-31 ENCOUNTER — Other Ambulatory Visit (HOSPITAL_BASED_OUTPATIENT_CLINIC_OR_DEPARTMENT_OTHER): Payer: Self-pay

## 2022-06-01 ENCOUNTER — Other Ambulatory Visit (HOSPITAL_BASED_OUTPATIENT_CLINIC_OR_DEPARTMENT_OTHER): Payer: Self-pay

## 2022-06-01 MED ORDER — LOSARTAN POTASSIUM 25 MG PO TABS
25.0000 mg | ORAL_TABLET | Freq: Every day | ORAL | 1 refills | Status: DC
  Filled 2022-06-01: qty 90, 90d supply, fill #0

## 2022-06-02 ENCOUNTER — Other Ambulatory Visit (HOSPITAL_COMMUNITY): Payer: Self-pay

## 2022-06-02 ENCOUNTER — Other Ambulatory Visit (HOSPITAL_BASED_OUTPATIENT_CLINIC_OR_DEPARTMENT_OTHER): Payer: Self-pay

## 2022-06-06 ENCOUNTER — Other Ambulatory Visit (HOSPITAL_COMMUNITY): Payer: Self-pay

## 2022-06-09 ENCOUNTER — Other Ambulatory Visit (HOSPITAL_COMMUNITY): Payer: Self-pay

## 2022-06-13 ENCOUNTER — Other Ambulatory Visit (HOSPITAL_BASED_OUTPATIENT_CLINIC_OR_DEPARTMENT_OTHER): Payer: Self-pay

## 2022-06-13 ENCOUNTER — Other Ambulatory Visit (HOSPITAL_COMMUNITY): Payer: Self-pay

## 2022-06-13 MED ORDER — LEVONORGEST-ETH ESTRAD 91-DAY 0.15-0.03 MG PO TABS
1.0000 | ORAL_TABLET | Freq: Every day | ORAL | 1 refills | Status: DC
  Filled 2022-06-13: qty 91, 91d supply, fill #0

## 2022-06-29 ENCOUNTER — Other Ambulatory Visit (HOSPITAL_COMMUNITY): Payer: Self-pay

## 2022-07-04 ENCOUNTER — Other Ambulatory Visit (HOSPITAL_BASED_OUTPATIENT_CLINIC_OR_DEPARTMENT_OTHER): Payer: Self-pay

## 2022-07-05 ENCOUNTER — Other Ambulatory Visit (HOSPITAL_COMMUNITY): Payer: Self-pay

## 2022-07-07 ENCOUNTER — Other Ambulatory Visit (HOSPITAL_COMMUNITY): Payer: Self-pay

## 2022-07-19 ENCOUNTER — Other Ambulatory Visit (HOSPITAL_BASED_OUTPATIENT_CLINIC_OR_DEPARTMENT_OTHER): Payer: Self-pay

## 2022-07-19 MED ORDER — IBUPROFEN 800 MG PO TABS
800.0000 mg | ORAL_TABLET | Freq: Four times a day (QID) | ORAL | 0 refills | Status: DC | PRN
  Filled 2022-07-19: qty 30, 7d supply, fill #0

## 2022-07-19 MED ORDER — CHLORHEXIDINE GLUCONATE 0.12 % MT SOLN
OROMUCOSAL | 0 refills | Status: DC
  Filled 2022-07-19: qty 473, 16d supply, fill #0

## 2022-07-19 MED ORDER — PENICILLIN V POTASSIUM 500 MG PO TABS
ORAL_TABLET | ORAL | 0 refills | Status: DC
  Filled 2022-07-19: qty 28, 7d supply, fill #0

## 2022-07-28 ENCOUNTER — Other Ambulatory Visit (HOSPITAL_BASED_OUTPATIENT_CLINIC_OR_DEPARTMENT_OTHER): Payer: Self-pay

## 2022-07-28 ENCOUNTER — Other Ambulatory Visit (HOSPITAL_COMMUNITY): Payer: Self-pay

## 2022-07-28 MED ORDER — LOW-OGESTREL 0.3-30 MG-MCG PO TABS
ORAL_TABLET | ORAL | 11 refills | Status: DC
Start: 2022-07-28 — End: 2022-08-26
  Filled 2022-07-28: qty 28, 20d supply, fill #0
  Filled 2022-07-28: qty 28, 10d supply, fill #0
  Filled 2022-08-05 (×2): qty 28, 28d supply, fill #0
  Filled 2022-08-05: qty 28, 20d supply, fill #1
  Filled 2022-08-05: qty 28, 10d supply, fill #0
  Filled 2022-08-25: qty 28, 10d supply, fill #1

## 2022-07-29 ENCOUNTER — Other Ambulatory Visit (HOSPITAL_COMMUNITY): Payer: Self-pay

## 2022-08-01 DIAGNOSIS — Z79899 Other long term (current) drug therapy: Secondary | ICD-10-CM | POA: Diagnosis not present

## 2022-08-01 DIAGNOSIS — Z7185 Encounter for immunization safety counseling: Secondary | ICD-10-CM | POA: Diagnosis not present

## 2022-08-01 DIAGNOSIS — B20 Human immunodeficiency virus [HIV] disease: Secondary | ICD-10-CM | POA: Diagnosis not present

## 2022-08-01 DIAGNOSIS — Z23 Encounter for immunization: Secondary | ICD-10-CM | POA: Diagnosis not present

## 2022-08-03 ENCOUNTER — Other Ambulatory Visit (HOSPITAL_COMMUNITY): Payer: Self-pay

## 2022-08-04 ENCOUNTER — Other Ambulatory Visit (HOSPITAL_BASED_OUTPATIENT_CLINIC_OR_DEPARTMENT_OTHER): Payer: Self-pay

## 2022-08-05 ENCOUNTER — Other Ambulatory Visit (HOSPITAL_BASED_OUTPATIENT_CLINIC_OR_DEPARTMENT_OTHER): Payer: Self-pay

## 2022-08-05 ENCOUNTER — Other Ambulatory Visit (HOSPITAL_COMMUNITY): Payer: Self-pay

## 2022-08-08 ENCOUNTER — Other Ambulatory Visit (HOSPITAL_COMMUNITY): Payer: Self-pay

## 2022-08-09 ENCOUNTER — Other Ambulatory Visit (HOSPITAL_COMMUNITY): Payer: Self-pay

## 2022-08-11 ENCOUNTER — Other Ambulatory Visit (HOSPITAL_COMMUNITY): Payer: Self-pay

## 2022-08-15 ENCOUNTER — Other Ambulatory Visit (HOSPITAL_BASED_OUTPATIENT_CLINIC_OR_DEPARTMENT_OTHER): Payer: Self-pay

## 2022-08-15 DIAGNOSIS — I10 Essential (primary) hypertension: Secondary | ICD-10-CM | POA: Diagnosis not present

## 2022-08-15 DIAGNOSIS — Z6835 Body mass index (BMI) 35.0-35.9, adult: Secondary | ICD-10-CM | POA: Diagnosis not present

## 2022-08-15 MED ORDER — LOSARTAN POTASSIUM 25 MG PO TABS
ORAL_TABLET | ORAL | 1 refills | Status: DC
  Filled 2022-08-15: qty 90, 45d supply, fill #0
  Filled 2022-10-04: qty 90, 45d supply, fill #1

## 2022-08-24 ENCOUNTER — Other Ambulatory Visit: Payer: Self-pay | Admitting: Pharmacist

## 2022-08-24 ENCOUNTER — Other Ambulatory Visit (HOSPITAL_COMMUNITY): Payer: Self-pay

## 2022-08-24 DIAGNOSIS — B20 Human immunodeficiency virus [HIV] disease: Secondary | ICD-10-CM

## 2022-08-24 MED ORDER — TRIUMEQ 600-50-300 MG PO TABS: 1.0000 | ORAL_TABLET | Freq: Every day | ORAL | 10 refills | Status: DC

## 2022-08-25 ENCOUNTER — Other Ambulatory Visit: Payer: Self-pay | Admitting: Pharmacist

## 2022-08-25 ENCOUNTER — Other Ambulatory Visit (HOSPITAL_COMMUNITY): Payer: Self-pay

## 2022-08-25 DIAGNOSIS — B20 Human immunodeficiency virus [HIV] disease: Secondary | ICD-10-CM

## 2022-08-25 MED ORDER — TRIUMEQ 600-50-300 MG PO TABS
1.0000 | ORAL_TABLET | Freq: Every day | ORAL | 10 refills | Status: DC
  Filled 2022-08-25: qty 30, 30d supply, fill #0
  Filled 2022-09-23: qty 30, 30d supply, fill #1
  Filled 2022-10-24: qty 30, 30d supply, fill #2
  Filled 2022-11-18: qty 30, 30d supply, fill #3
  Filled 2022-12-21: qty 30, 30d supply, fill #4
  Filled 2023-01-13: qty 30, 30d supply, fill #5
  Filled 2023-03-06: qty 30, 30d supply, fill #6
  Filled 2023-03-30: qty 30, 30d supply, fill #7
  Filled 2023-05-08: qty 30, 30d supply, fill #8
  Filled 2023-06-01: qty 30, 30d supply, fill #9

## 2022-08-26 ENCOUNTER — Other Ambulatory Visit (HOSPITAL_BASED_OUTPATIENT_CLINIC_OR_DEPARTMENT_OTHER): Payer: Self-pay

## 2022-08-26 ENCOUNTER — Encounter: Payer: Self-pay | Admitting: Emergency Medicine

## 2022-08-26 ENCOUNTER — Ambulatory Visit
Admission: EM | Admit: 2022-08-26 | Discharge: 2022-08-26 | Disposition: A | Discharge status: Home / Self Care | Payer: 59 | Attending: Family Medicine | Admitting: Family Medicine

## 2022-08-26 DIAGNOSIS — N939 Abnormal uterine and vaginal bleeding, unspecified: Secondary | ICD-10-CM | POA: Diagnosis not present

## 2022-08-26 MED ORDER — MEDROXYPROGESTERONE ACETATE 5 MG PO TABS: ORAL_TABLET | ORAL | 0 refills | Status: DC

## 2022-08-26 NOTE — Discharge Instructions (Addendum)
Try discontinuing ibuprofen.  If symptoms become significantly worse during the night or over the weekend, proceed to the local emergency room.

## 2022-08-26 NOTE — ED Provider Notes (Signed)
[name]    CSN: [id number] Arrival date & time: 08/26/22  1632      History   Chief Complaint Chief Complaint  Patient presents with   Vaginal Bleeding    HPI Cindy Kirk is a 46 y.o. female.   Patient complains of constant mild vaginal bleeding since her last normal period on 08/14/22.  She denies pelvic/abdominal pain, vaginal discharge, nausea/vomiting, and fevers, chills, and sweats.  Patient has a several year history of recurring menorrhagia.  Cervical stenosis had prevented endometrial biopsy in the past.  Pap smear and pelvic US in 2021 were normal except for presence of fibroid.  She subsequently underwent D&C/hysteroscopy 05/13/22.  Path report showed weakly proliferative endometrium without endometrial hyperplasia.  She had no post-op complications and started Seasonale.  She subsequently developed abnormal vaginal bleeding and was switched to Norgestrel-Ethinyl Estradiol 0.3-30 mg-mcg per tab.  Since onset of persistent bleeding after her last normal period 08/14/22, she stopped her OCP.   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  She also notes that she has been taking Ibuprofen '800mg'$  Q8h consistently.  She has a follow-up appointment with her GYN on 09/08/22.  The history is provided by the patient.    Past Medical History:  Diagnosis Date   HIV (human immunodeficiency virus infection) ([place])    Hypertension     Patient Active Problem List   Diagnosis Date Noted   Neuropathic pain 02/01/2017   Obesity 08/13/2015   Human immunodeficiency virus (HIV) disease ([place]) 07/30/2015   Essential (primary) hypertension 09/19/2012   Vitamin D deficiency 09/19/2012    Past Surgical History:  Procedure Laterality Date   cesaren     ECTOPIC PREGNANCY SURGERY      OB History   No obstetric history on file.      Home Medications    Prior to Admission medications   Medication Sig Start Date End  Date Taking? Authorizing Provider  abacavir-dolutegravir-lamiVUDine (TRIUMEQ) 600-50-300 MG tablet Take 1 tablet by mouth daily. 08/25/22  Yes Kuppelweiser, Cassie L, RPH-CPP  diltiazem (CARTIA XT) 240 MG 24 hr capsule TAKE 1 CAPSULE BY MOUTH ONCE DAILY FOR BLOOD PRESSURE 05/02/22  Yes   ibuprofen (ADVIL) 800 MG tablet TAKE 1 TABLET BY MOUTH EVERY 6 HOURS AS NEEDED. 07/19/22  Yes   losartan (COZAAR) 25 MG tablet Take 1 tablet (25 mg total) by mouth daily. 06/01/22  Yes   losartan (COZAAR) 25 MG tablet Take two tablets (50 mg dose) by mouth daily. 08/15/22  Yes   medroxyPROGESTERone (PROVERA) 5 MG tablet Take one tab PO once daily 08/26/22  Yes Arihant Pennings, Ishmael Holter, MD  chlorhexidine (PERIDEX) 0.12 % solution RINSE MOUTH WITH 15ML (1 CAPFUL) FOR 30 SECONDS EVERY MORNING AND EVERY EVENING AFTER TOOTHBRUSHING. EXPECTORATE AFTER RINSING, DO NOT SWALLOW 07/19/22     penicillin v potassium (VEETID) 500 MG tablet TAKE 1 TABLET BY MOUTH 4 TIMES DAILY UNTIL GONE. 07/19/22       Family History History reviewed. No pertinent family history.  Social History Social History   Tobacco Use   Smoking status: Never   Smokeless tobacco: Never  Vaping Use   Vaping Use: Never used  Substance Use Topics   Alcohol use: Yes    Comment: occ   Drug use: Never     Allergies   Patient has no known allergies.   Review of Systems Review of Systems  Constitutional:  Positive for fatigue.  Negative for activity change, appetite change, chills, diaphoresis and fever.  HENT: Negative.    Eyes: Negative.   Respiratory: Negative.    Cardiovascular: Negative.   Gastrointestinal: Negative.   Genitourinary:  Positive for menstrual problem and vaginal bleeding. Negative for dysuria, flank pain, frequency, hematuria, pelvic pain, urgency, vaginal discharge and vaginal pain.  Musculoskeletal: Negative.   Skin: Negative.   Neurological: Negative.      Physical Exam Triage Vital Signs ED Triage Vitals  Enc Vitals Group      BP 08/26/22 1644 (!) 147/91     Pulse Rate 08/26/22 1644 78     Resp 08/26/22 1644 18     Temp 08/26/22 1644 98.9 F (37.2 C)     Temp Source 08/26/22 1644 Oral     SpO2 08/26/22 1644 98 %     Weight 08/26/22 1646 180 lb (81.6 kg)     Height 08/26/22 1646 '5\' 1"'$  (1.549 m)     Head Circumference --      Peak Flow --      Pain Score 08/26/22 1645 2     Pain Loc --      Pain Edu? --      Excl. in [place]? --    No data found.  Updated Vital Signs BP (!) 147/91 (BP Location: Right Arm)   Pulse 78   Temp 98.9 F (37.2 C) (Oral)   Resp 18   Ht '5\' 1"'$  (1.549 m)   Wt 81.6 kg   LMP 08/14/2022 (Exact Date)   SpO2 98%   BMI 34.01 kg/m   Visual Acuity Right Eye Distance:   Left Eye Distance:   Bilateral Distance:    Right Eye Near:   Left Eye Near:    Bilateral Near:     Physical Exam Vitals and nursing note reviewed.  Constitutional:      General: She is not in acute distress.    Appearance: Normal appearance.  HENT:     Head: Normocephalic.     Mouth/Throat:     Pharynx: Oropharynx is clear.  Eyes:     Pupils: Pupils are equal, round, and reactive to light.  Cardiovascular:     Rate and Rhythm: Normal rate and regular rhythm.     Heart sounds: Normal heart sounds.  Pulmonary:     Breath sounds: Normal breath sounds.  Abdominal:     General: Bowel sounds are normal.     Palpations: Abdomen is soft.     Tenderness: There is no abdominal tenderness. There is no right CVA tenderness, left CVA tenderness or guarding.  Musculoskeletal:     Cervical back: Neck supple.     Right lower leg: No edema.     Left lower leg: No edema.  Lymphadenopathy:     Cervical: No cervical adenopathy.  Skin:    General: Skin is warm and dry.  Neurological:     Mental Status: She is alert.      UC Treatments / Results  Labs (all labs ordered are listed, but only abnormal results are displayed) Labs Reviewed - No data to display  EKG   Radiology No results  found.  Procedures Procedures (including critical care time)  Medications Ordered in UC Medications - No data to display  Initial Impression / Assessment and Plan / UC Course  I have reviewed the triage vital signs and the nursing notes.  I have reviewed multiple previous chart notes.  Pertinent labs & imaging results that were available  during my care of the patient were reviewed by me and considered in my medical decision making (see chart for details).    Begin Provera '5mg'$  once daily for one week.  Recommend discontinuing ibuprofen. Followup with GYN within 5 to 7 days.  Final Clinical Impressions(s) / UC Diagnoses   Final diagnoses:  Abnormal uterine bleeding     Discharge Instructions      Try discontinuing ibuprofen.  If symptoms become significantly worse during the night or over the weekend, proceed to the local emergency room.      ED Prescriptions     Medication Sig Dispense Auth. Provider   medroxyPROGESTERone (PROVERA) 5 MG tablet Take one tab PO once daily 7 tablet Kandra Nicolas, MD         Kandra Nicolas, MD 08/28/22 1022

## 2022-08-26 NOTE — ED Triage Notes (Signed)
Patient c/o increased vaginal bleeding since 08/14/22.  Patient had a D&C in June.  Patient was then placed on a BCP which stopped her period for a while.  Patient had an LMP on 08/14/22 and hasn't left since.  Patient is feeling drained, hgb is normal and follow up with GYN 09/08/22.

## 2022-08-30 ENCOUNTER — Other Ambulatory Visit (HOSPITAL_BASED_OUTPATIENT_CLINIC_OR_DEPARTMENT_OTHER): Payer: Self-pay

## 2022-08-30 MED ORDER — MEDROXYPROGESTERONE ACETATE 10 MG PO TABS
ORAL_TABLET | ORAL | 1 refills | Status: DC
  Filled 2022-08-30: qty 14, 14d supply, fill #0

## 2022-08-31 ENCOUNTER — Telehealth: Payer: Self-pay

## 2022-08-31 NOTE — Telephone Encounter (Signed)
Called to at request of Dr Assunta Found to see how shes doing since UC visit. Still having abnml bleeding. Pt has been in contact with her GYN, appt scheduled for 9/14. GYN doubled dose of progesterone as well 2 days ago.

## 2022-09-05 ENCOUNTER — Other Ambulatory Visit (HOSPITAL_BASED_OUTPATIENT_CLINIC_OR_DEPARTMENT_OTHER): Payer: Self-pay

## 2022-09-05 ENCOUNTER — Other Ambulatory Visit (HOSPITAL_COMMUNITY): Payer: Self-pay

## 2022-09-05 DIAGNOSIS — Z1231 Encounter for screening mammogram for malignant neoplasm of breast: Secondary | ICD-10-CM | POA: Diagnosis not present

## 2022-09-08 ENCOUNTER — Other Ambulatory Visit (HOSPITAL_BASED_OUTPATIENT_CLINIC_OR_DEPARTMENT_OTHER): Payer: Self-pay

## 2022-09-08 ENCOUNTER — Other Ambulatory Visit (HOSPITAL_COMMUNITY): Payer: Self-pay

## 2022-09-08 MED ORDER — MEDROXYPROGESTERONE ACETATE 10 MG PO TABS
10.0000 mg | ORAL_TABLET | Freq: Every day | ORAL | 1 refills | Status: DC
  Filled 2022-09-08 – 2022-09-12 (×2): qty 30, 30d supply, fill #0

## 2022-09-12 ENCOUNTER — Other Ambulatory Visit (HOSPITAL_BASED_OUTPATIENT_CLINIC_OR_DEPARTMENT_OTHER): Payer: Self-pay

## 2022-09-23 ENCOUNTER — Other Ambulatory Visit (HOSPITAL_COMMUNITY): Payer: Self-pay

## 2022-09-26 ENCOUNTER — Telehealth: Payer: Self-pay | Admitting: Pharmacist

## 2022-09-26 NOTE — Telephone Encounter (Signed)
Called patient to discuss HIV medication, Triumeq, for [facility]. No answer, unable to LVM as VM box full. Will reach out again at a later date.   Alfonse Spruce, PharmD, CPP, [place] Clinical Pharmacist Practitioner Infectious [facility]

## 2022-09-27 DIAGNOSIS — Z01818 Encounter for other preprocedural examination: Secondary | ICD-10-CM | POA: Diagnosis not present

## 2022-09-29 ENCOUNTER — Other Ambulatory Visit (HOSPITAL_COMMUNITY): Payer: Self-pay

## 2022-09-30 DIAGNOSIS — N920 Excessive and frequent menstruation with regular cycle: Secondary | ICD-10-CM | POA: Diagnosis not present

## 2022-09-30 DIAGNOSIS — Z21 Asymptomatic human immunodeficiency virus [HIV] infection status: Secondary | ICD-10-CM | POA: Diagnosis not present

## 2022-09-30 DIAGNOSIS — I10 Essential (primary) hypertension: Secondary | ICD-10-CM | POA: Diagnosis not present

## 2022-09-30 DIAGNOSIS — Z79890 Hormone replacement therapy (postmenopausal): Secondary | ICD-10-CM | POA: Diagnosis not present

## 2022-09-30 DIAGNOSIS — Z79899 Other long term (current) drug therapy: Secondary | ICD-10-CM | POA: Diagnosis not present

## 2022-10-04 ENCOUNTER — Other Ambulatory Visit (HOSPITAL_BASED_OUTPATIENT_CLINIC_OR_DEPARTMENT_OTHER): Payer: Self-pay

## 2022-10-13 DIAGNOSIS — Z09 Encounter for follow-up examination after completed treatment for conditions other than malignant neoplasm: Secondary | ICD-10-CM | POA: Diagnosis not present

## 2022-10-17 DIAGNOSIS — F4321 Adjustment disorder with depressed mood: Secondary | ICD-10-CM | POA: Diagnosis not present

## 2022-10-17 DIAGNOSIS — I10 Essential (primary) hypertension: Secondary | ICD-10-CM | POA: Diagnosis not present

## 2022-10-24 ENCOUNTER — Other Ambulatory Visit (HOSPITAL_COMMUNITY): Payer: Self-pay

## 2022-10-27 ENCOUNTER — Other Ambulatory Visit (HOSPITAL_COMMUNITY): Payer: Self-pay

## 2022-11-07 ENCOUNTER — Other Ambulatory Visit (HOSPITAL_BASED_OUTPATIENT_CLINIC_OR_DEPARTMENT_OTHER): Payer: Self-pay

## 2022-11-08 ENCOUNTER — Other Ambulatory Visit (HOSPITAL_BASED_OUTPATIENT_CLINIC_OR_DEPARTMENT_OTHER): Payer: Self-pay

## 2022-11-08 MED ORDER — DILTIAZEM HCL ER COATED BEADS 240 MG PO CP24
240.0000 mg | ORAL_CAPSULE | Freq: Every day | ORAL | 1 refills | Status: DC
  Filled 2022-11-08: qty 90, 90d supply, fill #0
  Filled 2023-01-06 – 2023-02-13 (×2): qty 90, 90d supply, fill #1

## 2022-11-15 ENCOUNTER — Other Ambulatory Visit (HOSPITAL_BASED_OUTPATIENT_CLINIC_OR_DEPARTMENT_OTHER): Payer: Self-pay

## 2022-11-15 MED ORDER — LOSARTAN POTASSIUM 25 MG PO TABS
50.0000 mg | ORAL_TABLET | Freq: Every day | ORAL | 1 refills | Status: DC
  Filled 2022-11-15: qty 90, 45d supply, fill #0
  Filled 2023-01-06: qty 90, 45d supply, fill #1

## 2022-11-16 ENCOUNTER — Other Ambulatory Visit: Payer: Self-pay

## 2022-11-16 ENCOUNTER — Ambulatory Visit (INDEPENDENT_AMBULATORY_CARE_PROVIDER_SITE_OTHER): Payer: 59 | Admitting: Pharmacist

## 2022-11-16 ENCOUNTER — Other Ambulatory Visit (HOSPITAL_COMMUNITY): Payer: Self-pay

## 2022-11-16 DIAGNOSIS — B20 Human immunodeficiency virus [HIV] disease: Secondary | ICD-10-CM

## 2022-11-16 NOTE — Progress Notes (Signed)
Virtual Visit via Telephone Note  I connected with Cindy Kirk on 11/16/22 at 10:00 AM EST by telephone and verified that I am speaking with the correct person using two identifiers.  Location: Patient: Home Provider: Office   I discussed the limitations, risks, security and privacy concerns of performing an evaluation and management service by telephone and the availability of in person appointments. I also discussed with the patient that there may be a patient responsible charge related to this service. The patient expressed understanding and agreed to proceed.  HPI: Cindy Kirk is a 46 y.o. female who presents for follow-up of their long-term specialty medication, Triumeq.  Patient Active Problem List   Diagnosis Date Noted   Neuropathic pain 02/01/2017   Obesity 08/13/2015   Human immunodeficiency virus (HIV) disease ([place]) 07/30/2015   Essential (primary) hypertension 09/19/2012   Vitamin D deficiency 09/19/2012    Patient's Medications  New Prescriptions   No medications on file  Previous Medications   ABACAVIR-DOLUTEGRAVIR-LAMIVUDINE (TRIUMEQ) 600-50-300 MG TABLET    Take 1 tablet by mouth daily.   DILTIAZEM (CARTIA XT) 240 MG 24 HR CAPSULE    Take 1 capsule (240 mg total) by mouth daily for blood pressure.   GABAPENTIN (NEURONTIN) 100 MG CAPSULE    Take 100 mg by mouth 3 (three) times daily.   IBUPROFEN (ADVIL) 800 MG TABLET    TAKE 1 TABLET BY MOUTH EVERY 6 HOURS AS NEEDED.   LOSARTAN (COZAAR) 25 MG TABLET    Take 2 tablets (50 mg total) by mouth daily.  Modified Medications   No medications on file  Discontinued Medications   CHLORHEXIDINE (PERIDEX) 0.12 % SOLUTION    RINSE MOUTH WITH 15ML (1 CAPFUL) FOR 30 SECONDS EVERY MORNING AND EVERY EVENING AFTER TOOTHBRUSHING. EXPECTORATE AFTER RINSING, DO NOT SWALLOW   LOSARTAN (COZAAR) 25 MG TABLET    Take 1 tablet (25 mg total) by mouth daily.   MEDROXYPROGESTERONE (PROVERA) 10 MG TABLET    Take one tablet (10 mg dose) by  mouth daily.   MEDROXYPROGESTERONE (PROVERA) 10 MG TABLET    Take 1 tablet (10 mg total) by mouth daily.   MEDROXYPROGESTERONE (PROVERA) 5 MG TABLET    Take one tab PO once daily   PENICILLIN V POTASSIUM (VEETID) 500 MG TABLET    TAKE 1 TABLET BY MOUTH 4 TIMES DAILY UNTIL GONE.    Allergies: No Known Allergies  Past Medical History: Past Medical History:  Diagnosis Date   HIV (human immunodeficiency virus infection) ([place])    Hypertension     Social History: Social History   Socioeconomic History   Marital status: Single    Spouse name: Not on file   Number of children: Not on file   Years of education: Not on file   Highest education level: Not on file  Occupational History   Not on file  Tobacco Use   Smoking status: Never   Smokeless tobacco: Never  Vaping Use   Vaping Use: Never used  Substance and Sexual Activity   Alcohol use: Yes    Comment: occ   Drug use: Never   Sexual activity: Not on file  Other Topics Concern   Not on file  Social History Narrative   Not on file   Social Determinants of Health   Financial Resource Strain: Not on file  Food Insecurity: Not on file  Transportation Needs: Not on file  Physical Activity: Not on file  Stress: Not on file  Social Connections: Not  on file    Labs: No results found for: "HIV1RNAQUANT", "HIV1RNAVL", "CD4TABS"  RPR and STI No results found for: "LABRPR", "RPRTITER"      No data to display          Hepatitis B No results found for: "HEPBSAB", "HEPBSAG", "HEPBCAB" Hepatitis C No results found for: "HEPCAB", "HCVRNAPCRQN" Hepatitis A No results found for: "HAV" Lipids: No results found for: "CHOL", "TRIG", "HDL", "CHOLHDL", "VLDL", "LDLCALC"  Assessment: I spoke with Cindy Kirk today regarding their specialty medication, Triumeq. Patient takes it every day without any issues or missed doses. No problems with adverse effects or tolerability. No problems getting it from [facility]. Updated/reviewed medication list - no drug interactions. All questions answered. Will follow up in 1 year.  Plan: - Continue Triumeq - Follow up in 1 year   I discussed the assessment and treatment plan with the patient. The patient was provided an opportunity to ask questions and all were answered. The patient agreed with the plan and demonstrated an understanding of the instructions.   The patient was advised to call back or seek an in-person evaluation if the symptoms worsen or if the condition fails to improve as anticipated.  I provided 5 minutes of non-face-to-face time during this encounter.  Cindy Kirk L. Veryl Abril, PharmD, BCIDP, AAHIVP, CPP Clinical Pharmacist Practitioner Infectious Diseases [place] [facility] 11/16/2022, 10:40 AM

## 2022-11-18 ENCOUNTER — Other Ambulatory Visit (HOSPITAL_COMMUNITY): Payer: Self-pay

## 2022-11-22 ENCOUNTER — Other Ambulatory Visit (HOSPITAL_COMMUNITY): Payer: Self-pay

## 2022-11-25 ENCOUNTER — Other Ambulatory Visit (HOSPITAL_COMMUNITY): Payer: Self-pay

## 2022-12-07 DIAGNOSIS — I10 Essential (primary) hypertension: Secondary | ICD-10-CM | POA: Diagnosis not present

## 2022-12-07 DIAGNOSIS — R0982 Postnasal drip: Secondary | ICD-10-CM | POA: Diagnosis not present

## 2022-12-07 DIAGNOSIS — R009 Unspecified abnormalities of heart beat: Secondary | ICD-10-CM | POA: Diagnosis not present

## 2022-12-07 DIAGNOSIS — R509 Fever, unspecified: Secondary | ICD-10-CM | POA: Diagnosis not present

## 2022-12-07 DIAGNOSIS — Z1152 Encounter for screening for COVID-19: Secondary | ICD-10-CM | POA: Diagnosis not present

## 2022-12-07 DIAGNOSIS — B349 Viral infection, unspecified: Secondary | ICD-10-CM | POA: Diagnosis not present

## 2022-12-20 ENCOUNTER — Other Ambulatory Visit (HOSPITAL_COMMUNITY): Payer: Self-pay

## 2022-12-21 ENCOUNTER — Other Ambulatory Visit (HOSPITAL_COMMUNITY): Payer: Self-pay

## 2022-12-21 ENCOUNTER — Other Ambulatory Visit: Payer: Self-pay

## 2022-12-22 ENCOUNTER — Other Ambulatory Visit: Payer: Self-pay

## 2023-01-06 ENCOUNTER — Other Ambulatory Visit (HOSPITAL_BASED_OUTPATIENT_CLINIC_OR_DEPARTMENT_OTHER): Payer: Self-pay

## 2023-01-13 ENCOUNTER — Other Ambulatory Visit (HOSPITAL_COMMUNITY): Payer: Self-pay

## 2023-01-17 ENCOUNTER — Other Ambulatory Visit: Payer: Self-pay

## 2023-01-24 ENCOUNTER — Other Ambulatory Visit (HOSPITAL_BASED_OUTPATIENT_CLINIC_OR_DEPARTMENT_OTHER): Payer: Self-pay

## 2023-01-24 MED ORDER — WEGOVY 0.25 MG/0.5ML ~~LOC~~ SOAJ
SUBCUTANEOUS | 0 refills | Status: DC
  Filled 2023-01-24: qty 2, 28d supply, fill #0

## 2023-01-25 ENCOUNTER — Other Ambulatory Visit (HOSPITAL_BASED_OUTPATIENT_CLINIC_OR_DEPARTMENT_OTHER): Payer: Self-pay

## 2023-01-26 ENCOUNTER — Other Ambulatory Visit (HOSPITAL_BASED_OUTPATIENT_CLINIC_OR_DEPARTMENT_OTHER): Payer: Self-pay

## 2023-01-31 ENCOUNTER — Other Ambulatory Visit (HOSPITAL_BASED_OUTPATIENT_CLINIC_OR_DEPARTMENT_OTHER): Payer: Self-pay

## 2023-02-03 ENCOUNTER — Other Ambulatory Visit: Payer: Self-pay

## 2023-02-06 DIAGNOSIS — B20 Human immunodeficiency virus [HIV] disease: Secondary | ICD-10-CM | POA: Diagnosis not present

## 2023-02-06 DIAGNOSIS — Z79624 Long term (current) use of inhibitors of nucleotide synthesis: Secondary | ICD-10-CM | POA: Diagnosis not present

## 2023-02-08 ENCOUNTER — Other Ambulatory Visit (HOSPITAL_BASED_OUTPATIENT_CLINIC_OR_DEPARTMENT_OTHER): Payer: Self-pay

## 2023-02-13 ENCOUNTER — Other Ambulatory Visit (HOSPITAL_BASED_OUTPATIENT_CLINIC_OR_DEPARTMENT_OTHER): Payer: Self-pay

## 2023-02-14 ENCOUNTER — Other Ambulatory Visit (HOSPITAL_BASED_OUTPATIENT_CLINIC_OR_DEPARTMENT_OTHER): Payer: Self-pay

## 2023-02-17 ENCOUNTER — Other Ambulatory Visit (HOSPITAL_COMMUNITY): Payer: Self-pay

## 2023-02-22 ENCOUNTER — Other Ambulatory Visit (HOSPITAL_BASED_OUTPATIENT_CLINIC_OR_DEPARTMENT_OTHER): Payer: Self-pay

## 2023-02-22 MED ORDER — LOSARTAN POTASSIUM 25 MG PO TABS
50.0000 mg | ORAL_TABLET | Freq: Every day | ORAL | 1 refills | Status: DC
  Filled 2023-02-22: qty 90, 45d supply, fill #0
  Filled 2023-04-13: qty 90, 45d supply, fill #1

## 2023-02-22 MED ORDER — WEGOVY 0.25 MG/0.5ML ~~LOC~~ SOAJ: SUBCUTANEOUS | 0 refills | Status: AC

## 2023-02-23 ENCOUNTER — Other Ambulatory Visit (HOSPITAL_BASED_OUTPATIENT_CLINIC_OR_DEPARTMENT_OTHER): Payer: Self-pay

## 2023-02-28 ENCOUNTER — Other Ambulatory Visit (HOSPITAL_COMMUNITY): Payer: Self-pay

## 2023-03-01 ENCOUNTER — Other Ambulatory Visit (HOSPITAL_BASED_OUTPATIENT_CLINIC_OR_DEPARTMENT_OTHER): Payer: Self-pay

## 2023-03-02 ENCOUNTER — Other Ambulatory Visit (HOSPITAL_BASED_OUTPATIENT_CLINIC_OR_DEPARTMENT_OTHER): Payer: Self-pay

## 2023-03-02 MED ORDER — WEGOVY 0.5 MG/0.5ML ~~LOC~~ SOAJ
0.5000 mg | SUBCUTANEOUS | 0 refills | Status: AC
  Filled 2023-03-02: qty 2, 28d supply, fill #0

## 2023-03-03 ENCOUNTER — Other Ambulatory Visit (HOSPITAL_COMMUNITY): Payer: Self-pay

## 2023-03-06 ENCOUNTER — Other Ambulatory Visit (HOSPITAL_COMMUNITY): Payer: Self-pay

## 2023-03-07 ENCOUNTER — Other Ambulatory Visit (HOSPITAL_COMMUNITY): Payer: Self-pay

## 2023-03-28 ENCOUNTER — Other Ambulatory Visit (HOSPITAL_BASED_OUTPATIENT_CLINIC_OR_DEPARTMENT_OTHER): Payer: Self-pay

## 2023-03-28 MED ORDER — CICLOPIROX 8 % EX SOLN
CUTANEOUS | 6 refills | Status: AC
  Filled 2023-03-28: qty 6.6, 30d supply, fill #0

## 2023-03-28 MED ORDER — TERBINAFINE HCL 250 MG PO TABS
250.0000 mg | ORAL_TABLET | Freq: Every day | ORAL | 0 refills | Status: AC
  Filled 2023-03-28: qty 90, 90d supply, fill #0

## 2023-03-30 ENCOUNTER — Other Ambulatory Visit (HOSPITAL_COMMUNITY): Payer: Self-pay

## 2023-04-03 ENCOUNTER — Other Ambulatory Visit (HOSPITAL_BASED_OUTPATIENT_CLINIC_OR_DEPARTMENT_OTHER): Payer: Self-pay

## 2023-04-03 ENCOUNTER — Other Ambulatory Visit: Payer: Self-pay

## 2023-04-03 MED ORDER — WEGOVY 1 MG/0.5ML ~~LOC~~ SOAJ
1.0000 mg | SUBCUTANEOUS | 0 refills | Status: AC
  Filled 2023-04-03 – 2023-04-10 (×2): qty 2, 28d supply, fill #0

## 2023-04-04 ENCOUNTER — Other Ambulatory Visit (HOSPITAL_BASED_OUTPATIENT_CLINIC_OR_DEPARTMENT_OTHER): Payer: Self-pay

## 2023-04-07 ENCOUNTER — Other Ambulatory Visit (HOSPITAL_BASED_OUTPATIENT_CLINIC_OR_DEPARTMENT_OTHER): Payer: Self-pay

## 2023-04-10 ENCOUNTER — Other Ambulatory Visit (HOSPITAL_BASED_OUTPATIENT_CLINIC_OR_DEPARTMENT_OTHER): Payer: Self-pay

## 2023-04-11 ENCOUNTER — Encounter: Payer: Self-pay | Admitting: *Deleted

## 2023-04-13 ENCOUNTER — Other Ambulatory Visit (HOSPITAL_BASED_OUTPATIENT_CLINIC_OR_DEPARTMENT_OTHER): Payer: Self-pay

## 2023-04-25 ENCOUNTER — Other Ambulatory Visit: Payer: Self-pay

## 2023-04-27 ENCOUNTER — Other Ambulatory Visit: Payer: Self-pay

## 2023-05-01 ENCOUNTER — Other Ambulatory Visit: Payer: Self-pay

## 2023-05-08 ENCOUNTER — Other Ambulatory Visit: Payer: Self-pay

## 2023-05-08 ENCOUNTER — Other Ambulatory Visit (HOSPITAL_BASED_OUTPATIENT_CLINIC_OR_DEPARTMENT_OTHER): Payer: Self-pay

## 2023-05-08 DIAGNOSIS — Z6835 Body mass index (BMI) 35.0-35.9, adult: Secondary | ICD-10-CM | POA: Diagnosis not present

## 2023-05-08 DIAGNOSIS — I10 Essential (primary) hypertension: Secondary | ICD-10-CM | POA: Diagnosis not present

## 2023-05-08 MED ORDER — ONDANSETRON HCL 4 MG PO TABS
4.0000 mg | ORAL_TABLET | Freq: Three times a day (TID) | ORAL | 0 refills | Status: AC | PRN
  Filled 2023-05-08 – 2023-05-10 (×2): qty 30, 10d supply, fill #0

## 2023-05-08 MED ORDER — HYDROCHLOROTHIAZIDE 12.5 MG PO TABS
12.5000 mg | ORAL_TABLET | Freq: Every day | ORAL | 2 refills | Status: AC
  Filled 2023-05-08: qty 30, 30d supply, fill #0
  Filled 2023-06-09: qty 30, 30d supply, fill #1

## 2023-05-08 MED ORDER — WEGOVY 1.7 MG/0.75ML ~~LOC~~ SOAJ
1.7000 mg | SUBCUTANEOUS | 0 refills | Status: AC
  Filled 2023-05-08 – 2023-05-10 (×2): qty 3, 28d supply, fill #0

## 2023-05-09 ENCOUNTER — Other Ambulatory Visit (HOSPITAL_BASED_OUTPATIENT_CLINIC_OR_DEPARTMENT_OTHER): Payer: Self-pay

## 2023-05-09 ENCOUNTER — Other Ambulatory Visit (HOSPITAL_COMMUNITY): Payer: Self-pay

## 2023-05-10 ENCOUNTER — Other Ambulatory Visit (HOSPITAL_BASED_OUTPATIENT_CLINIC_OR_DEPARTMENT_OTHER): Payer: Self-pay

## 2023-05-23 ENCOUNTER — Other Ambulatory Visit (HOSPITAL_BASED_OUTPATIENT_CLINIC_OR_DEPARTMENT_OTHER): Payer: Self-pay

## 2023-05-26 ENCOUNTER — Other Ambulatory Visit (HOSPITAL_BASED_OUTPATIENT_CLINIC_OR_DEPARTMENT_OTHER): Payer: Self-pay

## 2023-05-26 MED ORDER — DILTIAZEM HCL ER COATED BEADS 240 MG PO CP24
ORAL_CAPSULE | ORAL | 1 refills | Status: AC
  Filled 2023-05-26: qty 90, 90d supply, fill #0

## 2023-06-01 ENCOUNTER — Other Ambulatory Visit (HOSPITAL_COMMUNITY): Payer: Self-pay

## 2023-06-02 ENCOUNTER — Other Ambulatory Visit (HOSPITAL_COMMUNITY): Payer: Self-pay

## 2023-06-09 ENCOUNTER — Other Ambulatory Visit (HOSPITAL_BASED_OUTPATIENT_CLINIC_OR_DEPARTMENT_OTHER): Payer: Self-pay

## 2023-06-14 ENCOUNTER — Other Ambulatory Visit (HOSPITAL_BASED_OUTPATIENT_CLINIC_OR_DEPARTMENT_OTHER): Payer: Self-pay

## 2023-06-14 MED ORDER — LOSARTAN POTASSIUM 25 MG PO TABS
50.0000 mg | ORAL_TABLET | Freq: Every day | ORAL | 1 refills | Status: AC
  Filled 2023-06-14: qty 90, 45d supply, fill #0

## 2023-06-27 ENCOUNTER — Other Ambulatory Visit (HOSPITAL_COMMUNITY): Payer: Self-pay

## 2023-06-27 ENCOUNTER — Other Ambulatory Visit: Payer: Self-pay

## 2023-07-07 ENCOUNTER — Other Ambulatory Visit (HOSPITAL_BASED_OUTPATIENT_CLINIC_OR_DEPARTMENT_OTHER): Payer: Self-pay

## 2023-07-07 DIAGNOSIS — I10 Essential (primary) hypertension: Secondary | ICD-10-CM | POA: Diagnosis not present

## 2023-07-07 DIAGNOSIS — Z6835 Body mass index (BMI) 35.0-35.9, adult: Secondary | ICD-10-CM | POA: Diagnosis not present

## 2023-07-07 MED ORDER — LOSARTAN POTASSIUM-HCTZ 100-12.5 MG PO TABS
1.0000 | ORAL_TABLET | Freq: Every day | ORAL | 0 refills | Status: DC
  Filled 2023-07-07: qty 90, 90d supply, fill #0

## 2023-07-10 ENCOUNTER — Other Ambulatory Visit (HOSPITAL_COMMUNITY): Payer: Self-pay

## 2023-07-10 ENCOUNTER — Other Ambulatory Visit: Payer: Self-pay | Admitting: *Deleted

## 2023-07-24 ENCOUNTER — Ambulatory Visit (INDEPENDENT_AMBULATORY_CARE_PROVIDER_SITE_OTHER): Payer: Commercial Managed Care - PPO | Admitting: Family Medicine

## 2023-07-24 VITALS — BP 128/88 | Ht 60.0 in | Wt 190.0 lb

## 2023-07-24 DIAGNOSIS — M5412 Radiculopathy, cervical region: Secondary | ICD-10-CM | POA: Diagnosis not present

## 2023-07-24 NOTE — Patient Instructions (Signed)
We will go ahead with an MRI of your cervical spine. I will call you with the results and next steps.

## 2023-07-25 ENCOUNTER — Encounter: Payer: Self-pay | Admitting: Family Medicine

## 2023-07-25 NOTE — Progress Notes (Signed)
PCP: Baker Pierini, MD  Subjective:   HPI: Patient is a 47 y.o. female here for left shoulder, neck pain.  Patient reports she's had left sided neck/shoulder pain for several years dating back to when she saw me in the office in 2019. At that time she was found to have a disc extrusion at C4-5 with indentation of spinal cord and mild spinal stenosis.   She had a couple epidural injections with relief but not long lasting and symptoms have persisted. She has just been dealing with this. Completed physical therapy, gabapentin, nsaids. Pain left side of the neck and radiates into left upper extremity. No bowel/bladder dysfunction.  Past Medical History:  Diagnosis Date   HIV (human immunodeficiency virus infection) ([facility])    Hypertension     Current Outpatient Medications on File Prior to Visit  Medication Sig Dispense Refill   abacavir-dolutegravir-lamiVUDine (TRIUMEQ) 600-50-300 MG tablet Take 1 tablet by mouth daily. 30 tablet 10   ciclopirox (PENLAC) 8 % solution Apply topically at bedtime. Apply daily on nail and surrounding skin over previous coat. After 7 days remove with alcohol and repeat cycle. 6.6 mL 6   diltiazem (CARTIA XT) 240 MG 24 hr capsule Take 1 capsule (240 mg total) by mouth daily for blood pressure. 90 capsule 1   gabapentin (NEURONTIN) 100 MG capsule Take 100 mg by mouth 3 (three) times daily.     hydrochlorothiazide (HYDRODIURIL) 12.5 MG tablet Take 1 tablet (12.5 mg total) by mouth daily. 30 tablet 2   ibuprofen (ADVIL) 800 MG tablet TAKE 1 TABLET BY MOUTH EVERY 6 HOURS AS NEEDED. 30 tablet 0   losartan (COZAAR) 25 MG tablet Take 2 tablets (50 mg total) by mouth daily. 90 tablet 1   losartan-hydrochlorothiazide (HYZAAR) 100-12.5 MG tablet Take 1 tablet by mouth daily. 90 tablet 0   ondansetron (ZOFRAN) 4 MG tablet Take 1 tablet (4 mg total) by mouth every 8 (eight) hours as needed for Nausea 30 tablet 0   Semaglutide-Weight Management (WEGOVY) 0.25 MG/0.5ML  SOAJ Inject 0.5 mLs (0.25 mg dose) into the skin once a week. 6 mL 0   Semaglutide-Weight Management (WEGOVY) 0.5 MG/0.5ML SOAJ Inject 0.5 mg into the skin once a week. 2 mL 0   Semaglutide-Weight Management (WEGOVY) 1 MG/0.5ML SOAJ Inject 1 mg into the skin once a week. 2 mL 0   Semaglutide-Weight Management (WEGOVY) 1.7 MG/0.75ML SOAJ Take 1.7 mg by mouth once a week. 3 mL 0   terbinafine (LAMISIL) 250 MG tablet Take 1 tablet (250 mg total) by mouth daily. 90 tablet 0   Current Facility-Administered Medications on File Prior to Visit  Medication Dose Route Frequency Provider Last Rate Last Admin   gadobenate dimeglumine (MULTIHANCE) injection 15 mL  15 mL Intravenous Once PRN System, Provider Not In        Past Surgical History:  Procedure Laterality Date   cesaren     ECTOPIC PREGNANCY SURGERY      No Known Allergies  BP 128/88   Ht 5' (1.524 m)   Wt 190 lb (86.2 kg)   BMI 37.11 kg/m       No data to display              No data to display              Objective:  Physical Exam:  Gen: NAD, comfortable in exam room  Left shoulder: No swelling, ecchymoses.  No gross deformity. No TTP. FROM without pain.  Negative Hawkins, Neers. Negative Yergasons. Strength 5/5 with empty can and resisted internal/external rotation. Negative apprehension. NV intact distally.  Neck: No gross deformity, swelling, bruising. TTP left cervical paraspinal region.  No midline/bony TTP. FROM with pain on left lateral rotation radiating into left upper extremity. BUE strength 5/5.   Sensation intact to light touch.   2+ equal reflexes in triceps, biceps, brachioradialis tendons. Positive spurlings on left. NV intact distal BUEs.   Assessment & Plan:  1. Cervical radiculopathy - Patient with known disc extrusion cervical spine dating back 5 years now.  She tried epidurals x 2, OTC meds and nsaids, gabapentin, physical therapy.  Still struggling with pain.  Will repeat MRI to  assess.

## 2023-07-26 NOTE — Addendum Note (Signed)
Addended by: Annita Brod on: 07/26/2023 10:09 AM   Modules accepted: Orders

## 2023-07-30 ENCOUNTER — Ambulatory Visit (HOSPITAL_BASED_OUTPATIENT_CLINIC_OR_DEPARTMENT_OTHER)
Admission: RE | Admit: 2023-07-30 | Discharge: 2023-07-30 | Disposition: A | Discharge status: Home / Self Care | Payer: Commercial Managed Care - PPO | Source: Ambulatory Visit | Attending: Family Medicine | Admitting: Family Medicine

## 2023-07-30 DIAGNOSIS — M4802 Spinal stenosis, cervical region: Secondary | ICD-10-CM | POA: Diagnosis not present

## 2023-07-30 DIAGNOSIS — M5412 Radiculopathy, cervical region: Secondary | ICD-10-CM | POA: Diagnosis not present

## 2023-07-30 DIAGNOSIS — M5011 Cervical disc disorder with radiculopathy,  high cervical region: Secondary | ICD-10-CM | POA: Diagnosis not present

## 2023-08-02 ENCOUNTER — Other Ambulatory Visit: Payer: Self-pay

## 2023-08-02 DIAGNOSIS — M5412 Radiculopathy, cervical region: Secondary | ICD-10-CM

## 2023-08-06 ENCOUNTER — Other Ambulatory Visit: Payer: Commercial Managed Care - PPO

## 2023-08-14 DIAGNOSIS — G959 Disease of spinal cord, unspecified: Secondary | ICD-10-CM | POA: Diagnosis not present

## 2023-08-18 ENCOUNTER — Other Ambulatory Visit (HOSPITAL_COMMUNITY): Payer: Self-pay

## 2023-08-18 ENCOUNTER — Other Ambulatory Visit (HOSPITAL_BASED_OUTPATIENT_CLINIC_OR_DEPARTMENT_OTHER): Payer: Self-pay

## 2023-08-18 DIAGNOSIS — G959 Disease of spinal cord, unspecified: Secondary | ICD-10-CM | POA: Diagnosis not present

## 2023-08-18 MED ORDER — ACETAMINOPHEN-CODEINE 300-30 MG PO TABS
1.0000 | ORAL_TABLET | Freq: Four times a day (QID) | ORAL | 0 refills | Status: AC | PRN
  Filled 2023-08-18: qty 33, 4d supply, fill #0

## 2023-08-29 ENCOUNTER — Other Ambulatory Visit: Payer: Self-pay | Admitting: Pharmacist

## 2023-08-29 ENCOUNTER — Other Ambulatory Visit (HOSPITAL_COMMUNITY): Payer: Self-pay

## 2023-08-29 ENCOUNTER — Other Ambulatory Visit: Payer: Self-pay

## 2023-08-29 MED ORDER — TRIUMEQ 600-50-300 MG PO TABS
1.0000 | ORAL_TABLET | Freq: Every day | ORAL | 10 refills | Status: DC
  Filled 2023-08-29: qty 30, 30d supply, fill #0
  Filled 2023-09-22: qty 30, 30d supply, fill #1
  Filled 2023-10-13: qty 30, 30d supply, fill #2
  Filled 2023-11-20: qty 30, 30d supply, fill #3
  Filled 2023-12-18: qty 30, 30d supply, fill #4
  Filled 2024-01-04: qty 30, 30d supply, fill #5
  Filled 2024-02-06: qty 30, 30d supply, fill #6

## 2023-08-29 MED ORDER — TRIUMEQ 600-50-300 MG PO TABS: 1.0000 | ORAL_TABLET | Freq: Every day | ORAL | 10 refills | Status: DC

## 2023-08-30 ENCOUNTER — Other Ambulatory Visit (HOSPITAL_COMMUNITY): Payer: Self-pay

## 2023-09-01 ENCOUNTER — Other Ambulatory Visit: Payer: Self-pay | Admitting: Orthopedic Surgery

## 2023-09-04 ENCOUNTER — Other Ambulatory Visit (HOSPITAL_BASED_OUTPATIENT_CLINIC_OR_DEPARTMENT_OTHER): Payer: Self-pay

## 2023-09-04 DIAGNOSIS — Z13220 Encounter for screening for lipoid disorders: Secondary | ICD-10-CM | POA: Diagnosis not present

## 2023-09-04 DIAGNOSIS — B20 Human immunodeficiency virus [HIV] disease: Secondary | ICD-10-CM | POA: Diagnosis not present

## 2023-09-04 DIAGNOSIS — Z9189 Other specified personal risk factors, not elsewhere classified: Secondary | ICD-10-CM | POA: Diagnosis not present

## 2023-09-04 DIAGNOSIS — B354 Tinea corporis: Secondary | ICD-10-CM | POA: Diagnosis not present

## 2023-09-04 MED ORDER — ROSUVASTATIN CALCIUM 10 MG PO TABS
10.0000 mg | ORAL_TABLET | Freq: Every day | ORAL | 3 refills | Status: AC
  Filled 2023-09-04: qty 90, 90d supply, fill #0
  Filled 2023-12-12: qty 90, 90d supply, fill #1
  Filled 2024-03-19: qty 90, 90d supply, fill #2
  Filled 2024-07-11: qty 90, 90d supply, fill #3

## 2023-09-05 DIAGNOSIS — R92323 Mammographic fibroglandular density, bilateral breasts: Secondary | ICD-10-CM | POA: Diagnosis not present

## 2023-09-05 DIAGNOSIS — Z1231 Encounter for screening mammogram for malignant neoplasm of breast: Secondary | ICD-10-CM | POA: Diagnosis not present

## 2023-09-12 ENCOUNTER — Other Ambulatory Visit (HOSPITAL_COMMUNITY): Payer: Commercial Managed Care - PPO

## 2023-09-12 NOTE — Pre-Procedure Instructions (Signed)
Surgical Instructions   Your procedure is scheduled on Wednesday, September 25th. Report to [facility] Main Entrance "A" at 05:30 A.M., then check in with the Admitting office. Any questions or running late day of surgery: call [phone]  Questions prior to your surgery date: call [phone], Monday-Friday, 8am-4pm. If you experience any cold or flu symptoms such as cough, fever, chills, shortness of breath, etc. between now and your scheduled surgery, please notify us at the above number.     Remember:  Do not eat after midnight the night before your surgery  You may drink clear liquids until 04:30 AM the morning of your surgery.   Clear liquids allowed are: Water, Non-Citrus Juices (without pulp), Carbonated Beverages, Clear Tea, Black Coffee Only (NO MILK, CREAM OR POWDERED CREAMER of any kind), and Gatorade.  Patient Instructions  The night before surgery:  No food after midnight. ONLY clear liquids after midnight  The day of surgery (if you do NOT have diabetes):  Drink ONE (1) Pre-Surgery Clear Ensure by 04:30 AM the morning of surgery. Drink in one sitting. Do not sip.  This drink was given to you during your hospital  pre-op appointment visit.  Nothing else to drink after completing the  Pre-Surgery Clear Ensure.          If you have questions, please contact your surgeon's office.    Take these medicines the morning of surgery with A SIP OF WATER  abacavir-dolutegravir-lamiVUDine (TRIUMEQ)  diltiazem (CARTIA XT)  rosuvastatin (CRESTOR)    May take these medicines IF NEEDED: acetaminophen-codeine (TYLENOL #3)  ondansetron (ZOFRAN)    One week prior to surgery, STOP taking any Aspirin (unless otherwise instructed by your surgeon) Aleve, Naproxen, Ibuprofen, Motrin, Advil, Goody's, BC's, all herbal medications, fish oil, and non-prescription vitamins.                     Do NOT Smoke (Tobacco/Vaping) for 24 hours prior to your procedure.  If you use a CPAP  at night, you may bring your mask/headgear for your overnight stay.   You will be asked to remove any contacts, glasses, piercing's, hearing aid's, dentures/partials prior to surgery. Please bring cases for these items if needed.    Patients discharged the day of surgery will not be allowed to drive home, and someone needs to stay with them for 24 hours.  SURGICAL WAITING ROOM VISITATION Patients may have no more than 2 support people in the waiting area - these visitors may rotate.   Pre-op nurse will coordinate an appropriate time for 1 ADULT support person, who may not rotate, to accompany patient in pre-op.  Children under the age of 78 must have an adult with them who is not the patient and must remain in the main waiting area with an adult.  If the patient needs to stay at the hospital during part of their recovery, the visitor guidelines for inpatient rooms apply.  Please refer to the [facility] website for the visitor guidelines for any additional information.   If you received a COVID test during your pre-op visit  it is requested that you wear a mask when out in public, stay away from anyone that may not be feeling well and notify your surgeon if you develop symptoms. If you have been in contact with anyone that has tested positive in the last 10 days please notify you surgeon.      Pre-operative 5 CHG Bathing Instructions   You can play a  key role in reducing the risk of infection after surgery. Your skin needs to be as free of germs as possible. You can reduce the number of germs on your skin by washing with CHG (chlorhexidine gluconate) soap before surgery. CHG is an antiseptic soap that kills germs and continues to kill germs even after washing.   DO NOT use if you have an allergy to chlorhexidine/CHG or antibacterial soaps. If your skin becomes reddened or irritated, stop using the CHG and notify one of our RNs at [phone].   Please shower with the CHG soap starting 4  days before surgery using the following schedule:     Please keep in mind the following:  DO NOT shave, including legs and underarms, starting the day of your first shower.   You may shave your face at any point before/day of surgery.  Place clean sheets on your bed the day you start using CHG soap. Use a clean washcloth (not used since being washed) for each shower. DO NOT sleep with pets once you start using the CHG.   CHG Shower Instructions:  Wash your face and private area with normal soap. If you choose to wash your hair, wash first with your normal shampoo.  After you use shampoo/soap, rinse your hair and body thoroughly to remove shampoo/soap residue.  Turn the water OFF and apply about 3 tablespoons (45 ml) of CHG soap to a CLEAN washcloth.  Apply CHG soap ONLY FROM YOUR NECK DOWN TO YOUR TOES (washing for 3-5 minutes)  DO NOT use CHG soap on face, private areas, open wounds, or sores.  Pay special attention to the area where your surgery is being performed.  If you are having back surgery, having someone wash your back for you may be helpful. Wait 2 minutes after CHG soap is applied, then you may rinse off the CHG soap.  Pat dry with a clean towel  Put on clean clothes/pajamas   If you choose to wear lotion, please use ONLY the CHG-compatible lotions on the back of this paper.   Additional instructions for the day of surgery: DO NOT APPLY any lotions, deodorants, cologne, or perfumes.   Do not bring valuables to the hospital. [facility] is not responsible for any belongings/valuables. Do not wear nail polish, gel polish, artificial nails, or any other type of covering on natural nails (fingers and toes) Do not wear jewelry or makeup Put on clean/comfortable clothes.  Please brush your teeth.  Ask your nurse before applying any prescription medications to the skin.     CHG Compatible Lotions   Aveeno Moisturizing lotion  Cetaphil Moisturizing Cream  Cetaphil  Moisturizing Lotion  Clairol Herbal Essence Moisturizing Lotion, Dry Skin  Clairol Herbal Essence Moisturizing Lotion, Extra Dry Skin  Clairol Herbal Essence Moisturizing Lotion, Normal Skin  Curel Age Defying Therapeutic Moisturizing Lotion with Alpha Hydroxy  Curel Extreme Care Body Lotion  Curel Soothing Hands Moisturizing Hand Lotion  Curel Therapeutic Moisturizing Cream, Fragrance-Free  Curel Therapeutic Moisturizing Lotion, Fragrance-Free  Curel Therapeutic Moisturizing Lotion, Original Formula  Eucerin Daily Replenishing Lotion  Eucerin Dry Skin Therapy Plus Alpha Hydroxy Crme  Eucerin Dry Skin Therapy Plus Alpha Hydroxy Lotion  Eucerin Original Crme  Eucerin Original Lotion  Eucerin Plus Crme Eucerin Plus Lotion  Eucerin TriLipid Replenishing Lotion  Keri Anti-Bacterial Hand Lotion  Keri Deep Conditioning Original Lotion Dry Skin Formula Softly Scented  Keri Deep Conditioning Original Lotion, Fragrance Free Sensitive Skin Formula  Keri Lotion Fast Absorbing Fragrance  Free Sensitive Skin Formula  Keri Lotion Fast Absorbing Softly Scented Dry Skin Formula  Keri Original Lotion  Keri Skin Renewal Lotion Keri Silky Smooth Lotion  Keri Silky Smooth Sensitive Skin Lotion  Nivea Body Creamy Conditioning Oil  Nivea Body Extra Enriched Lotion  Nivea Body Original Lotion  Nivea Body Sheer Moisturizing Lotion Nivea Crme  Nivea Skin Firming Lotion  NutraDerm 30 Skin Lotion  NutraDerm Skin Lotion  NutraDerm Therapeutic Skin Cream  NutraDerm Therapeutic Skin Lotion  ProShield Protective Hand Cream  Provon moisturizing lotion  Please read over the following fact sheets that you were given.

## 2023-09-13 ENCOUNTER — Other Ambulatory Visit: Payer: Self-pay

## 2023-09-13 ENCOUNTER — Encounter (HOSPITAL_COMMUNITY)
Admission: RE | Admit: 2023-09-13 | Discharge: 2023-09-13 | Disposition: A | Discharge status: Home / Self Care | Payer: Commercial Managed Care - PPO | Source: Ambulatory Visit | Attending: Orthopedic Surgery | Admitting: Orthopedic Surgery

## 2023-09-13 ENCOUNTER — Encounter (HOSPITAL_COMMUNITY): Payer: Self-pay

## 2023-09-13 VITALS — BP 145/92 | HR 87 | Temp 98.4°F | Resp 18 | Ht 60.0 in | Wt 192.0 lb

## 2023-09-13 DIAGNOSIS — Z01818 Encounter for other preprocedural examination: Secondary | ICD-10-CM | POA: Insufficient documentation

## 2023-09-13 HISTORY — DX: Unspecified osteoarthritis, unspecified site: M19.90

## 2023-09-13 LAB — CBC
HCT: 43.1 % (ref 36.0–46.0)
Hemoglobin: 14.6 g/dL (ref 12.0–15.0)
MCH: 33.6 pg (ref 26.0–34.0)
MCHC: 33.9 g/dL (ref 30.0–36.0)
MCV: 99.1 fL (ref 80.0–100.0)
Platelets: 216 10*3/uL (ref 150–400)
RBC: 4.35 MIL/uL (ref 3.87–5.11)
RDW: 11.9 % (ref 11.5–15.5)
WBC: 8.6 10*3/uL (ref 4.0–10.5)
nRBC: 0 % (ref 0.0–0.2)

## 2023-09-13 LAB — BASIC METABOLIC PANEL WITH GFR
Anion gap: 8 (ref 5–15)
BUN: 11 mg/dL (ref 6–20)
CO2: 25 mmol/L (ref 22–32)
Calcium: 9.2 mg/dL (ref 8.9–10.3)
Chloride: 100 mmol/L (ref 98–111)
Creatinine, Ser: 0.81 mg/dL (ref 0.44–1.00)
GFR, Estimated: >60 mL/min (ref >60)
Glucose, Bld: 88 mg/dL (ref 70–99)
Potassium: 3.9 mmol/L (ref 3.5–5.1)
Sodium: 133 mmol/L — ABNORMAL LOW (ref 135–145)

## 2023-09-13 LAB — SURGICAL PCR SCREEN
MRSA, PCR: NEGATIVE
Staphylococcus aureus: NEGATIVE

## 2023-09-13 NOTE — Progress Notes (Signed)
PCP - Dr. Tamala Fothergill Cardiologist - denies  PPM/ICD - denies  Chest x-ray - n/a EKG - 09/13/23 Stress Test - denies ECHO - 07/30/13 Cardiac Cath - denies  Sleep Study - n/a CPAP - denies  Fasting Blood Sugar - n/a  Checks Blood Sugar _____ times a day denies  Last dose of GLP1 agonist-  don't take GLP1 instructions: n/a  Blood Thinner Instructions:n/a Aspirin Instructions: n/a  ERAS Protcol -y PRE-SURGERY Ensure    COVID TEST- n   Anesthesia review: N  Patient denies shortness of breath, fever, cough and chest pain at PAT appointment.  Patient denies any respiratory illness / issues at this tim.e   All instructions explained to the patient, with a verbal understanding of the material. Patient agrees to go over the instructions while at home for a better understanding. Patient also instructed to self quarantine after being tested for COVID-19. The opportunity to ask questions was provided.

## 2023-09-16 ENCOUNTER — Encounter (HOSPITAL_COMMUNITY): Payer: Self-pay

## 2023-09-18 DIAGNOSIS — G959 Disease of spinal cord, unspecified: Secondary | ICD-10-CM | POA: Diagnosis not present

## 2023-09-19 NOTE — Anesthesia Preprocedure Evaluation (Signed)
Anesthesia Evaluation  Patient identified by MRN, date of birth, ID band Patient awake    Reviewed: Allergy & Precautions, NPO status , Patient's Chart, lab work & pertinent test results  History of Anesthesia Complications Negative for: history of anesthetic complications  Airway Mallampati: III  TM Distance: >3 FB Neck ROM: Full    Dental  (+) Dental Advisory Given   Pulmonary neg pulmonary ROS   breath sounds clear to auscultation       Cardiovascular hypertension, Pt. on medications (-) angina  Rhythm:Regular Rate:Normal     Neuro/Psych    GI/Hepatic negative GI ROS, Neg liver ROS,,,  Endo/Other  Semaglutide BMI 37.5  Renal/GU negative Renal ROS     Musculoskeletal   Abdominal   Peds  Hematology negative hematology ROS (+) HIV  Anesthesia Other Findings   Reproductive/Obstetrics S/p ablation                             Anesthesia Physical Anesthesia Plan  ASA: 3  Anesthesia Plan: General   Post-op Pain Management: Tylenol PO (pre-op)*   Induction: Intravenous  PONV Risk Score and Plan: 3 and Dexamethasone, Scopolamine patch - Pre-op and Ondansetron  Airway Management Planned: Oral ETT  Additional Equipment: None  Intra-op Plan:   Post-operative Plan: Extubation in OR  Informed Consent: I have reviewed the patients History and Physical, chart, labs and discussed the procedure including the risks, benefits and alternatives for the proposed anesthesia with the patient or authorized representative who has indicated his/her understanding and acceptance.     Dental advisory given  Plan Discussed with: CRNA and Surgeon  Anesthesia Plan Comments:         Anesthesia Quick Evaluation

## 2023-09-20 ENCOUNTER — Ambulatory Visit (HOSPITAL_COMMUNITY): Payer: Self-pay | Admitting: Anesthesiology

## 2023-09-20 ENCOUNTER — Encounter (HOSPITAL_COMMUNITY)
Admission: RE | Disposition: A | Discharge status: Home / Self Care | Payer: Self-pay | Source: Home / Self Care | Attending: Orthopedic Surgery

## 2023-09-20 ENCOUNTER — Other Ambulatory Visit: Payer: Self-pay

## 2023-09-20 ENCOUNTER — Ambulatory Visit (HOSPITAL_COMMUNITY)
Admission: RE | Admit: 2023-09-20 | Discharge: 2023-09-20 | Disposition: A | Discharge status: Home / Self Care | Payer: Commercial Managed Care - PPO | Attending: Orthopedic Surgery | Admitting: Orthopedic Surgery

## 2023-09-20 ENCOUNTER — Other Ambulatory Visit (HOSPITAL_COMMUNITY): Payer: Self-pay

## 2023-09-20 ENCOUNTER — Ambulatory Visit (HOSPITAL_COMMUNITY): Payer: Commercial Managed Care - PPO

## 2023-09-20 ENCOUNTER — Encounter (HOSPITAL_COMMUNITY): Payer: Self-pay | Admitting: Orthopedic Surgery

## 2023-09-20 ENCOUNTER — Ambulatory Visit (HOSPITAL_BASED_OUTPATIENT_CLINIC_OR_DEPARTMENT_OTHER): Payer: Commercial Managed Care - PPO | Admitting: Anesthesiology

## 2023-09-20 DIAGNOSIS — Z21 Asymptomatic human immunodeficiency virus [HIV] infection status: Secondary | ICD-10-CM | POA: Diagnosis not present

## 2023-09-20 DIAGNOSIS — G9520 Unspecified cord compression: Secondary | ICD-10-CM | POA: Insufficient documentation

## 2023-09-20 DIAGNOSIS — I10 Essential (primary) hypertension: Secondary | ICD-10-CM | POA: Diagnosis not present

## 2023-09-20 DIAGNOSIS — Z79899 Other long term (current) drug therapy: Secondary | ICD-10-CM | POA: Insufficient documentation

## 2023-09-20 DIAGNOSIS — G9529 Other cord compression: Secondary | ICD-10-CM | POA: Diagnosis not present

## 2023-09-20 DIAGNOSIS — Z981 Arthrodesis status: Secondary | ICD-10-CM | POA: Diagnosis not present

## 2023-09-20 DIAGNOSIS — M47012 Anterior spinal artery compression syndromes, cervical region: Secondary | ICD-10-CM | POA: Diagnosis not present

## 2023-09-20 HISTORY — PX: ANTERIOR CERVICAL DECOMP/DISCECTOMY FUSION: SHX1161

## 2023-09-20 LAB — POCT PREGNANCY, URINE: Preg Test, Ur: NEGATIVE

## 2023-09-20 SURGERY — ANTERIOR CERVICAL DECOMPRESSION/DISCECTOMY FUSION 1 LEVEL
Anesthesia: General

## 2023-09-20 MED ORDER — DEXMEDETOMIDINE HCL IN NACL 80 MCG/20ML IV SOLN
INTRAVENOUS | Status: AC
  Filled 2023-09-20: qty 20

## 2023-09-20 MED ORDER — METHOCARBAMOL 750 MG PO TABS
750.0000 mg | ORAL_TABLET | Freq: Four times a day (QID) | ORAL | 2 refills | Status: AC | PRN
  Filled 2023-09-20: qty 30, 8d supply, fill #0

## 2023-09-20 MED ORDER — CEFAZOLIN SODIUM-DEXTROSE 2-4 GM/100ML-% IV SOLN
2.0000 g | INTRAVENOUS | Status: AC
  Administered 2023-09-20: 2 g via INTRAVENOUS
  Filled 2023-09-20: qty 100

## 2023-09-20 MED ORDER — THROMBIN 20000 UNITS EX SOLR
CUTANEOUS | Status: DC | PRN
  Administered 2023-09-20: 20 mL via TOPICAL

## 2023-09-20 MED ORDER — DEXAMETHASONE SODIUM PHOSPHATE 10 MG/ML IJ SOLN
INTRAMUSCULAR | Status: DC | PRN
  Administered 2023-09-20: 10 mg via INTRAVENOUS

## 2023-09-20 MED ORDER — OXYCODONE HCL 5 MG PO TABS: 5.0000 mg | ORAL_TABLET | Freq: Once | ORAL | Status: DC | PRN

## 2023-09-20 MED ORDER — ROCURONIUM BROMIDE 10 MG/ML (PF) SYRINGE
PREFILLED_SYRINGE | INTRAVENOUS | Status: AC
  Filled 2023-09-20: qty 10

## 2023-09-20 MED ORDER — PROMETHAZINE HCL 25 MG/ML IJ SOLN: 6.2500 mg | INTRAMUSCULAR | Status: DC | PRN

## 2023-09-20 MED ORDER — DEXMEDETOMIDINE HCL IN NACL 80 MCG/20ML IV SOLN
INTRAVENOUS | Status: DC | PRN
Start: 2023-09-20 — End: 2023-09-20
  Administered 2023-09-20: 12 ug via INTRAVENOUS

## 2023-09-20 MED ORDER — SUGAMMADEX SODIUM 200 MG/2ML IV SOLN
INTRAVENOUS | Status: DC | PRN
  Administered 2023-09-20: 200 mg via INTRAVENOUS

## 2023-09-20 MED ORDER — LACTATED RINGERS IV SOLN: INTRAVENOUS | Status: DC

## 2023-09-20 MED ORDER — PROPOFOL 10 MG/ML IV BOLUS
INTRAVENOUS | Status: AC
  Filled 2023-09-20: qty 20

## 2023-09-20 MED ORDER — PROPOFOL 10 MG/ML IV BOLUS
INTRAVENOUS | Status: DC | PRN
  Administered 2023-09-20: 150 mg via INTRAVENOUS
  Administered 2023-09-20 (×2): 50 mg via INTRAVENOUS

## 2023-09-20 MED ORDER — SURGIFLO WITH THROMBIN (HEMOSTATIC MATRIX KIT) OPTIME
TOPICAL | Status: DC | PRN
Start: 2023-09-20 — End: 2023-09-20
  Administered 2023-09-20: 1 via TOPICAL

## 2023-09-20 MED ORDER — FENTANYL CITRATE (PF) 250 MCG/5ML IJ SOLN
INTRAMUSCULAR | Status: DC | PRN
  Administered 2023-09-20 (×2): 50 ug via INTRAVENOUS
  Administered 2023-09-20: 150 ug via INTRAVENOUS

## 2023-09-20 MED ORDER — THROMBIN 20000 UNITS EX SOLR
CUTANEOUS | Status: AC
  Filled 2023-09-20: qty 20000

## 2023-09-20 MED ORDER — LIDOCAINE 2% (20 MG/ML) 5 ML SYRINGE
INTRAMUSCULAR | Status: AC
  Filled 2023-09-20: qty 5

## 2023-09-20 MED ORDER — MIDAZOLAM HCL 2 MG/2ML IJ SOLN
INTRAMUSCULAR | Status: AC
  Filled 2023-09-20: qty 2

## 2023-09-20 MED ORDER — LIDOCAINE 2% (20 MG/ML) 5 ML SYRINGE
INTRAMUSCULAR | Status: DC | PRN
  Administered 2023-09-20: 20 mg via INTRAVENOUS

## 2023-09-20 MED ORDER — ORAL CARE MOUTH RINSE: 15.0000 mL | Freq: Once | OROMUCOSAL | Status: AC

## 2023-09-20 MED ORDER — MIDAZOLAM HCL 2 MG/2ML IJ SOLN: 0.5000 mg | Freq: Once | INTRAMUSCULAR | Status: DC | PRN

## 2023-09-20 MED ORDER — HYDROCODONE-ACETAMINOPHEN 5-325 MG PO TABS
1.0000 | ORAL_TABLET | ORAL | 0 refills | Status: AC | PRN
Start: 2023-09-20 — End: ?
  Filled 2023-09-20: qty 30, 5d supply, fill #0

## 2023-09-20 MED ORDER — ROCURONIUM BROMIDE 10 MG/ML (PF) SYRINGE
PREFILLED_SYRINGE | INTRAVENOUS | Status: DC | PRN
  Administered 2023-09-20: 20 mg via INTRAVENOUS
  Administered 2023-09-20: 60 mg via INTRAVENOUS
  Administered 2023-09-20: 20 mg via INTRAVENOUS

## 2023-09-20 MED ORDER — DEXAMETHASONE SODIUM PHOSPHATE 10 MG/ML IJ SOLN
INTRAMUSCULAR | Status: AC
  Filled 2023-09-20: qty 1

## 2023-09-20 MED ORDER — PHENYLEPHRINE 80 MCG/ML (10ML) SYRINGE FOR IV PUSH (FOR BLOOD PRESSURE SUPPORT)
PREFILLED_SYRINGE | INTRAVENOUS | Status: DC | PRN
  Administered 2023-09-20 (×2): 160 ug via INTRAVENOUS

## 2023-09-20 MED ORDER — HYDROMORPHONE HCL 1 MG/ML IJ SOLN
0.2500 mg | INTRAMUSCULAR | Status: DC | PRN
  Administered 2023-09-20: 0.5 mg via INTRAVENOUS

## 2023-09-20 MED ORDER — HYDROMORPHONE HCL 1 MG/ML IJ SOLN
INTRAMUSCULAR | Status: AC
  Filled 2023-09-20: qty 1

## 2023-09-20 MED ORDER — ONDANSETRON HCL 4 MG/2ML IJ SOLN
INTRAMUSCULAR | Status: AC
  Filled 2023-09-20: qty 2

## 2023-09-20 MED ORDER — MEPERIDINE HCL 25 MG/ML IJ SOLN: 6.2500 mg | INTRAMUSCULAR | Status: DC | PRN

## 2023-09-20 MED ORDER — MIDAZOLAM HCL 2 MG/2ML IJ SOLN
INTRAMUSCULAR | Status: DC | PRN
  Administered 2023-09-20: 2 mg via INTRAVENOUS

## 2023-09-20 MED ORDER — PHENYLEPHRINE HCL-NACL 20-0.9 MG/250ML-% IV SOLN
INTRAVENOUS | Status: DC | PRN
  Administered 2023-09-20: 40 ug/min via INTRAVENOUS

## 2023-09-20 MED ORDER — OXYCODONE HCL 5 MG PO TABS
ORAL_TABLET | ORAL | Status: AC
  Filled 2023-09-20: qty 1

## 2023-09-20 MED ORDER — ONDANSETRON HCL 4 MG/2ML IJ SOLN
INTRAMUSCULAR | Status: DC | PRN
  Administered 2023-09-20: 4 mg via INTRAVENOUS

## 2023-09-20 MED ORDER — SCOPOLAMINE 1 MG/3DAYS TD PT72
1.0000 | MEDICATED_PATCH | TRANSDERMAL | Status: DC
  Administered 2023-09-20: 1.5 mg via TRANSDERMAL
  Filled 2023-09-20: qty 1

## 2023-09-20 MED ORDER — BUPIVACAINE-EPINEPHRINE 0.25% -1:200000 IJ SOLN
INTRAMUSCULAR | Status: DC | PRN
  Administered 2023-09-20: 6 mL

## 2023-09-20 MED ORDER — ACETAMINOPHEN 500 MG PO TABS
1000.0000 mg | ORAL_TABLET | Freq: Once | ORAL | Status: AC
  Administered 2023-09-20: 1000 mg via ORAL
  Filled 2023-09-20: qty 2

## 2023-09-20 MED ORDER — CHLORHEXIDINE GLUCONATE 0.12 % MT SOLN
15.0000 mL | Freq: Once | OROMUCOSAL | Status: AC
  Administered 2023-09-20: 15 mL via OROMUCOSAL
  Filled 2023-09-20: qty 15

## 2023-09-20 MED ORDER — OXYCODONE HCL 5 MG/5ML PO SOLN: 5.0000 mg | Freq: Once | ORAL | Status: DC | PRN

## 2023-09-20 MED ORDER — FENTANYL CITRATE (PF) 250 MCG/5ML IJ SOLN
INTRAMUSCULAR | Status: AC
  Filled 2023-09-20: qty 5

## 2023-09-20 MED ORDER — BUPIVACAINE-EPINEPHRINE (PF) 0.25% -1:200000 IJ SOLN
INTRAMUSCULAR | Status: AC
  Filled 2023-09-20: qty 30

## 2023-09-20 MED ORDER — HYDROMORPHONE HCL 1 MG/ML IJ SOLN
INTRAMUSCULAR | Status: DC | PRN
  Administered 2023-09-20: .5 mg via INTRAVENOUS

## 2023-09-20 MED ORDER — HYDROMORPHONE HCL 1 MG/ML IJ SOLN
INTRAMUSCULAR | Status: AC
  Filled 2023-09-20: qty 0.5

## 2023-09-20 SURGICAL SUPPLY — 72 items
AGENT HMST KT MTR STRL THRMB (HEMOSTASIS) ×1
APL SKNCLS STERI-STRIP NONHPOA (GAUZE/BANDAGES/DRESSINGS) ×1
BAG COUNTER SPONGE SURGICOUNT (BAG) ×1 IMPLANT
BAG SPNG CNTER NS LX DISP (BAG) ×1
BENZOIN TINCTURE PRP APPL 2/3 (GAUZE/BANDAGES/DRESSINGS) ×1 IMPLANT
BIT DRILL NEURO 2X3.1 SFT TUCH (MISCELLANEOUS) ×1 IMPLANT
BIT DRILL SRG 14X2.2XFLT CHK (BIT) IMPLANT
BIT DRL SRG 14X2.2XFLT CHK (BIT) ×1
BLADE CLIPPER SURG (BLADE) ×1 IMPLANT
BLADE SURG 15 STRL LF DISP TIS (BLADE) ×1 IMPLANT
BLADE SURG 15 STRL SS (BLADE) ×1
CORD BIPOLAR FORCEPS 12FT (ELECTRODE) ×1 IMPLANT
COVER SURGICAL LIGHT HANDLE (MISCELLANEOUS) ×1 IMPLANT
DRAIN JACKSON RD 7FR 3/32 (WOUND CARE) IMPLANT
DRAPE C-ARM 42X72 X-RAY (DRAPES) ×1 IMPLANT
DRAPE POUCH INSTRU U-SHP 10X18 (DRAPES) ×1 IMPLANT
DRAPE SURG 17X23 STRL (DRAPES) ×3 IMPLANT
DRILL BIT SKYLINE 14MM (BIT) ×1
DRILL NEURO 2X3.1 SOFT TOUCH (MISCELLANEOUS) ×1
DURAPREP 26ML APPLICATOR (WOUND CARE) ×1 IMPLANT
ELECT COATED BLADE 2.86 ST (ELECTRODE) ×1 IMPLANT
ELECT REM PT RETURN 9FT ADLT (ELECTROSURGICAL) ×1
ELECTRODE REM PT RTRN 9FT ADLT (ELECTROSURGICAL) ×1 IMPLANT
EVACUATOR SILICONE 100CC (DRAIN) IMPLANT
GAUZE 4X4 16PLY ~~LOC~~+RFID DBL (SPONGE) ×1 IMPLANT
GAUZE SPONGE 4X4 12PLY STRL (GAUZE/BANDAGES/DRESSINGS) ×1 IMPLANT
GLOVE BIO SURGEON STRL SZ 6.5 (GLOVE) ×1 IMPLANT
GLOVE BIO SURGEON STRL SZ8 (GLOVE) ×1 IMPLANT
GLOVE BIOGEL PI IND STRL 7.0 (GLOVE) ×2 IMPLANT
GLOVE BIOGEL PI IND STRL 8 (GLOVE) ×1 IMPLANT
GLOVE SURG ENC MOIS LTX SZ6.5 (GLOVE) ×1 IMPLANT
GOWN STRL REUS W/ TWL LRG LVL3 (GOWN DISPOSABLE) ×1 IMPLANT
GOWN STRL REUS W/ TWL XL LVL3 (GOWN DISPOSABLE) ×1 IMPLANT
GOWN STRL REUS W/TWL LRG LVL3 (GOWN DISPOSABLE) ×2
GOWN STRL REUS W/TWL XL LVL3 (GOWN DISPOSABLE) ×2
IV CATH 14GX2 1/4 (CATHETERS) ×1 IMPLANT
KIT BASIN OR (CUSTOM PROCEDURE TRAY) ×1 IMPLANT
KIT TURNOVER KIT B (KITS) ×1 IMPLANT
MANIFOLD NEPTUNE II (INSTRUMENTS) ×1 IMPLANT
NDL PRECISIONGLIDE 27X1.5 (NEEDLE) ×1 IMPLANT
NDL SPNL 20GX3.5 QUINCKE YW (NEEDLE) ×1 IMPLANT
NEEDLE PRECISIONGLIDE 27X1.5 (NEEDLE) ×1
NEEDLE SPNL 20GX3.5 QUINCKE YW (NEEDLE) ×1
NS IRRIG 1000ML POUR BTL (IV SOLUTION) ×1 IMPLANT
PACK ORTHO CERVICAL (CUSTOM PROCEDURE TRAY) ×1 IMPLANT
PAD ARMBOARD 7.5X6 YLW CONV (MISCELLANEOUS) ×2 IMPLANT
PATTIES SURGICAL .5 X.5 (GAUZE/BANDAGES/DRESSINGS) IMPLANT
PATTIES SURGICAL .5 X1 (DISPOSABLE) IMPLANT
PIN DISTRACTION 14 (PIN) IMPLANT
PLATE SKYLINE 12MM (Plate) IMPLANT
POSITIONER HEAD DONUT 9IN (MISCELLANEOUS) ×1 IMPLANT
SCREW SKYLINE VAR OS 14MM (Screw) IMPLANT
SCREW VAR SELF TAP SKYLINE 14M (Screw) IMPLANT
SPACER ACF PARALLEL 7MM (Bone Implant) IMPLANT
SPONGE INTESTINAL PEANUT (DISPOSABLE) ×1 IMPLANT
SPONGE SURGIFOAM ABS GEL 100 (HEMOSTASIS) IMPLANT
STRIP CLOSURE SKIN 1/2X4 (GAUZE/BANDAGES/DRESSINGS) ×1 IMPLANT
SURGIFLO W/THROMBIN 8M KIT (HEMOSTASIS) IMPLANT
SUT MNCRL AB 4-0 PS2 18 (SUTURE) ×1 IMPLANT
SUT SILK 2 0 TIES 10X30 (SUTURE) IMPLANT
SUT SILK 4 0 (SUTURE)
SUT SILK 4-0 18XBRD TIE 12 (SUTURE) IMPLANT
SUT VIC AB 2-0 CT2 18 VCP726D (SUTURE) ×1 IMPLANT
SYR BULB IRRIG 60ML STRL (SYRINGE) ×1 IMPLANT
SYR CONTROL 10ML LL (SYRINGE) ×2 IMPLANT
TAPE CLOTH 4X10 WHT NS (GAUZE/BANDAGES/DRESSINGS) ×1 IMPLANT
TAPE CLOTH SURG 4X10 WHT LF (GAUZE/BANDAGES/DRESSINGS) IMPLANT
TAPE UMBILICAL 1/8X30 (MISCELLANEOUS) ×2 IMPLANT
TOWEL GREEN STERILE (TOWEL DISPOSABLE) ×1 IMPLANT
TOWEL GREEN STERILE FF (TOWEL DISPOSABLE) ×1 IMPLANT
WATER STERILE IRR 1000ML POUR (IV SOLUTION) ×1 IMPLANT
YANKAUER SUCT BULB TIP NO VENT (SUCTIONS) ×1 IMPLANT

## 2023-09-20 NOTE — H&P (Signed)
PREOPERATIVE H&P  Chief Complaint: Neck pain  HPI: Cindy Kirk is a 47 y.o. female who presents with ongoing pain in the neck  MRI reveals spinal cord compression at C4/5  Patient has failed multiple forms of conservative care and continues to have pain (see office notes for additional details regarding the patient's full course of treatment)  Past Medical History:  Diagnosis Date   Arthritis    knee's   HIV (human immunodeficiency virus infection) (HCC) 2003   Hypertension 2003   Umbilical hernia 2003   Past Surgical History:  Procedure Laterality Date   cesaren  2003   ECTOPIC PREGNANCY SURGERY  2001   Social History   Socioeconomic History   Marital status: Single    Spouse name: Not on file   Number of children: 1   Years of education: Not on file   Highest education level: Not on file  Occupational History   Not on file  Tobacco Use   Smoking status: Never   Smokeless tobacco: Never  Vaping Use   Vaping status: Never Used  Substance and Sexual Activity   Alcohol use: Not Currently   Drug use: Never   Sexual activity: Not on file  Other Topics Concern   Not on file  Social History Narrative   Not on file   Social Determinants of Health   Financial Resource Strain: Low Risk  (07/06/2023)   Received from [facility]   Overall Financial Resource Strain (CARDIA)    Difficulty of Paying Living Expenses: Not hard at all  Food Insecurity: Low Risk  (09/04/2023)   Received from [facility]   Hunger Vital Sign    Worried About Running Out of Food in the Last Year: Never true    Ran Out of Food in the Last Year: Never true  Transportation Needs: No Transportation Needs (09/04/2023)   Received from [facility]    In the past 12 months, has lack of reliable transportation kept you from medical appointments, meetings, work or from getting things needed for daily living? : No  Physical Activity: Sufficiently Active (07/06/2023)    Received from [facility]   Exercise Vital Sign    Days of Exercise per Week: 5 days    Minutes of Exercise per Session: 30 min  Stress: No Stress Concern Present (07/06/2023)   Received from [facility] - Occupational Stress Questionnaire    Feeling of Stress : Not at all  Social Connections: Socially Integrated (07/06/2023)   Received from [facility]   Social Network    How would you rate your social network (family, work, friends)?: Good participation with social networks   History reviewed. No pertinent family history. No Known Allergies Prior to Admission medications   Medication Sig Start Date End Date Taking? Authorizing Provider  abacavir-dolutegravir-lamiVUDine (TRIUMEQ) 600-50-300 MG tablet TAKE 1 TABLET BY MOUTH DAILY. 08/29/23  Yes Jennette Kettle, RPH-CPP  acetaminophen-codeine (TYLENOL #3) 300-30 MG tablet Take 1-2 tablets by mouth every 6 (six) to 8 (eight) hours as needed for pain 08/18/23  Yes   diltiazem (CARTIA XT) 240 MG 24 hr capsule Take 1 capsule (240 mg total) by mouth daily for blood pressure. 05/26/23  Yes   losartan-hydrochlorothiazide (HYZAAR) 100-12.5 MG tablet Take 1 tablet by mouth daily. 07/07/23  Yes   rosuvastatin (CRESTOR) 10 MG tablet Take 1 tablet (10 mg total) by mouth daily. 09/04/23  Yes  ciclopirox (PENLAC) 8 % solution Apply topically at bedtime. Apply daily on nail and surrounding skin over previous coat. After 7 days remove with alcohol and repeat cycle. Patient not taking: Reported on 09/07/2023 03/28/23     hydrochlorothiazide (HYDRODIURIL) 12.5 MG tablet Take 1 tablet (12.5 mg total) by mouth daily. Patient not taking: Reported on 09/07/2023 05/08/23     ibuprofen (ADVIL) 800 MG tablet TAKE 1 TABLET BY MOUTH EVERY 6 HOURS AS NEEDED. Patient not taking: Reported on 09/07/2023 07/19/22     losartan (COZAAR) 25 MG tablet Take 2 tablets (50 mg total) by mouth daily. Patient not taking: Reported on 09/07/2023  06/14/23     ondansetron (ZOFRAN) 4 MG tablet Take 1 tablet (4 mg total) by mouth every 8 (eight) hours as needed for Nausea 05/08/23     Semaglutide-Weight Management (WEGOVY) 0.25 MG/0.5ML SOAJ Inject 0.5 mLs (0.25 mg dose) into the skin once a week. Patient not taking: Reported on 09/07/2023 02/22/23     Semaglutide-Weight Management (WEGOVY) 0.5 MG/0.5ML SOAJ Inject 0.5 mg into the skin once a week. Patient not taking: Reported on 09/07/2023 03/01/23     Semaglutide-Weight Management (WEGOVY) 1 MG/0.5ML SOAJ Inject 1 mg into the skin once a week. Patient not taking: Reported on 09/07/2023 04/03/23     Semaglutide-Weight Management (WEGOVY) 1.7 MG/0.75ML SOAJ Take 1.7 mg by mouth once a week. Patient not taking: Reported on 09/07/2023 05/08/23     terbinafine (LAMISIL) 250 MG tablet Take 1 tablet (250 mg total) by mouth daily. Patient not taking: Reported on 09/07/2023 03/28/23        All other systems have been reviewed and were otherwise negative with the exception of those mentioned in the HPI and as above.  Physical Exam: Vitals:   09/20/23 0543  BP: (!) 149/66  Pulse: 91  Resp: 18  Temp: 98.8 F (37.1 C)  SpO2: 99%    Body mass index is 37.5 kg/m.  General: Alert, no acute distress Cardiovascular: No pedal edema Respiratory: No cyanosis, no use of accessory musculature Skin: No lesions in the area of chief complaint Neurologic: Sensation intact distally Psychiatric: Patient is competent for consent with normal mood and affect Lymphatic: No axillary or cervical lymphadenopathy   Assessment/Plan: CERVICAL CORD COMPRESSION, C4/5 Plan for Procedure(s): ANTERIOR CERVICAL DECOMPRESSION FUSION CERVICAL 4- CERVICAL 5 WITH INSTRUMENTATION AND ALLOGRAFT   Jackelyn Hoehn, MD 09/20/2023 7:24 AM

## 2023-09-20 NOTE — Transfer of Care (Signed)
Immediate Anesthesia Transfer of Care Note  Patient: Cindy Kirk  Procedure(s) Performed: ANTERIOR CERVICAL DECOMPRESSION FUSION CERVICAL FOUR- CERVICAL FIVE WITH INSTRUMENTATION AND ALLOGRAFT  Patient Location: PACU  Anesthesia Type:General  Level of Consciousness: awake, drowsy, patient cooperative, and responds to stimulation  Airway & Oxygen Therapy: Patient Spontanous Breathing and Patient connected to face mask oxygen  Post-op Assessment: Report given to RN, Post -op Vital signs reviewed and stable, and Patient moving all extremities X 4  Post vital signs: Reviewed and stable  Last Vitals:  Vitals Value Taken Time  BP 152/89 09/20/23 1010  Temp 36.2 C 09/20/23 1010  Pulse 89 09/20/23 1012  Resp 12 09/20/23 1012  SpO2 99 % 09/20/23 1012  Vitals shown include unfiled device data.  Last Pain:  Vitals:   09/20/23 0616  TempSrc:   PainSc: 5       Patients Stated Pain Goal: 5 (09/20/23 0616)  Complications: No notable events documented.

## 2023-09-20 NOTE — Anesthesia Procedure Notes (Signed)
Procedure Name: Intubation Date/Time: 09/20/2023 7:56 AM  Performed by: Shary Decamp, CRNAPre-anesthesia Checklist: Patient identified, Patient being monitored, Timeout performed, Emergency Drugs available and Suction available Patient Re-evaluated:Patient Re-evaluated prior to induction Oxygen Delivery Method: Circle system utilized Preoxygenation: Pre-oxygenation with 100% oxygen Induction Type: IV induction Ventilation: Mask ventilation without difficulty and Oral airway inserted - appropriate to patient size Laryngoscope Size: Mac, 3 and Glidescope Grade View: Grade I Tube type: Oral Tube size: 7.0 mm Number of attempts: 1 Airway Equipment and Method: Video-laryngoscopy and Rigid stylet Placement Confirmation: ETT inserted through vocal cords under direct vision, positive ETCO2 and breath sounds checked- equal and bilateral Secured at: 21 cm Tube secured with: Tape Dental Injury: Teeth and Oropharynx as per pre-operative assessment  Difficulty Due To: Difficult Airway- due to large tongue, Difficult Airway- due to reduced neck mobility and Difficult Airway- due to limited oral opening

## 2023-09-20 NOTE — Op Note (Signed)
PATIENT NAME: Cindy Kirk   MEDICAL RECORD NO.:   [id number]    DATE OF BIRTH: April 13, 1976   DATE OF PROCEDURE: 09/20/2023                               OPERATIVE REPORT     PREOPERATIVE DIAGNOSES: 1.  Spinal cord compression, C4-5 2.  Cervical myelopathy   POSTOPERATIVE DIAGNOSES: PREOPERATIVE DIAGNOSES: 1.  Spinal cord compression, C4-5 2.  Cervical myelopathy   PROCEDURE: 1. Anterior cervical decompression and fusion C4/5 2. Placement of anterior instrumentation, C4/5 3. Insertion of structural allograft x 1 (VG2 allograft spacer). 4. Intraoperative use of fluoroscopy.   SURGEON:  Estill Bamberg, MD   ASSISTANT: None   ANESTHESIA:  General endotracheal anesthesia.   COMPLICATIONS:  None.   DISPOSITION:  Stable.   ESTIMATED BLOOD LOSS:  Minimal.   INDICATIONS FOR SURGERY:  Briefly, Cindy Kirk is a pleasant 47 -year- old female, who did present to me with pain in her neck, and symptoms consistent with cervical myelopathy.  Specifically, she did have pain extending into her left shoulder, with numbness in her left hand. The patient's MRI did reveal the findings noted above.  Given the spinal cord compression noted on her MRI,, we did discuss proceeding with the procedure noted above.  The patient was fully aware of the risks and limitations of surgery as outlined in my preoperative note.   OPERATIVE DETAILS:  On 09/20/2023, the patient was brought to surgery and general endotracheal anesthesia was administered.  The patient was placed supine on the hospital bed. The neck was gently extended.  All bony prominences were meticulously padded.  The neck was prepped and draped in the usual sterile fashion.  At this point, I did make a left-sided transverse incision.  The platysma was incised.  A Smith-Robinson approach was used and the anterior spine was identified. A self-retaining retractor was placed.  I then subperiosteally exposed the vertebral bodies from C4-C5.   Caspar pins were then placed into the C4 and C5 vertebral bodies and distraction was applied.  A thorough and complete C4-5 intervertebral diskectomy was performed.  The posterior longitudinal ligament was identified and entered using a nerve hook.  I then used #1 followed by #2 Kerrison to perform a thorough and complete intervertebral diskectomy.  Extruded disc fragments were readily identified, which were noted to be compressive.  These were removed uneventfully. The spinal canal was thoroughly decompressed at the termination of this portion of the procedure.  The endplates were then prepared and the appropriate-sized structural allograft (7mm) was tamped into position in the usual fashion. The Caspar pins then were removed and bone wax was placed in their place.  The appropriate-sized anterior cervical plate was placed over the anterior spine.  14 mm variable angle screws were placed, 2 in each vertebral body from C4-C5 for a total of 4 vertebral body screws.  The screws were then locked to the plate using the Cam locking mechanism.  I was very pleased with the final fluoroscopic images.  The wound was then irrigated.  The wound was then explored for any undue bleeding and there was no bleeding noted. The wound was then closed in layers using 2-0 Vicryl, followed by 4-0 Monocryl.  Benzoin and Steri-Strips were applied, followed by sterile dressing.  All instrument counts were correct at the termination of the procedure.       Estill Bamberg, MD

## 2023-09-20 NOTE — Anesthesia Postprocedure Evaluation (Signed)
Anesthesia Post Note  Patient: Cindy Kirk  Procedure(s) Performed: ANTERIOR CERVICAL DECOMPRESSION FUSION CERVICAL FOUR- CERVICAL FIVE WITH INSTRUMENTATION AND ALLOGRAFT     Patient location during evaluation: PACU Anesthesia Type: General Level of consciousness: patient cooperative, oriented and sedated Pain management: pain level controlled Vital Signs Assessment: post-procedure vital signs reviewed and stable Respiratory status: spontaneous breathing, nonlabored ventilation and respiratory function stable Cardiovascular status: blood pressure returned to baseline and stable Postop Assessment: no apparent nausea or vomiting Anesthetic complications: no   No notable events documented.  Last Vitals:  Vitals:   09/20/23 1215 09/20/23 1230  BP: (!) 137/93 (!) 140/90  Pulse: 97 91  Resp: (!) 21 12  Temp:  (!) 36.2 C  SpO2: 93% 95%    Last Pain:  Vitals:   09/20/23 1230  TempSrc:   PainSc: Asleep                 Adriannah Steinkamp,E. Rajat Staver

## 2023-09-21 ENCOUNTER — Encounter (HOSPITAL_COMMUNITY): Payer: Self-pay | Admitting: Orthopedic Surgery

## 2023-09-22 ENCOUNTER — Other Ambulatory Visit: Payer: Self-pay

## 2023-09-22 NOTE — Progress Notes (Signed)
Specialty Pharmacy Refill Coordination Note  Cindy Kirk is a 46 y.o. female contacted today regarding refills of specialty medication(s) Abacavir-Dolutegravir-Lamivud .  Patient requested Delivery  on 09/26/23  to verified address [address] [name] [facility] [id number]   Medication will be filled on 09/25/23.

## 2023-10-04 DIAGNOSIS — Z9889 Other specified postprocedural states: Secondary | ICD-10-CM | POA: Diagnosis not present

## 2023-10-06 DIAGNOSIS — L918 Other hypertrophic disorders of the skin: Secondary | ICD-10-CM | POA: Diagnosis not present

## 2023-10-09 ENCOUNTER — Other Ambulatory Visit (HOSPITAL_BASED_OUTPATIENT_CLINIC_OR_DEPARTMENT_OTHER): Payer: Self-pay

## 2023-10-10 ENCOUNTER — Other Ambulatory Visit (HOSPITAL_BASED_OUTPATIENT_CLINIC_OR_DEPARTMENT_OTHER): Payer: Self-pay

## 2023-10-10 MED ORDER — LOSARTAN POTASSIUM-HCTZ 100-12.5 MG PO TABS
1.0000 | ORAL_TABLET | Freq: Every day | ORAL | 0 refills | Status: DC
  Filled 2023-10-10: qty 90, 90d supply, fill #0

## 2023-10-11 ENCOUNTER — Other Ambulatory Visit: Payer: Self-pay

## 2023-10-13 ENCOUNTER — Other Ambulatory Visit: Payer: Self-pay

## 2023-10-13 ENCOUNTER — Encounter (HOSPITAL_COMMUNITY): Payer: Self-pay

## 2023-10-13 NOTE — Progress Notes (Signed)
Specialty Pharmacy Ongoing Clinical Assessment Note  Cindy Kirk is a 47 y.o. female who is being followed by the specialty pharmacy service for RxSp HIV   Patient's specialty medication(s) reviewed today: Abacavir-Dolutegravir-Lamivud   Missed doses in the last 4 weeks: 0   Patient/Caregiver did not have any additional questions or concerns.   Therapeutic benefit summary: Patient is achieving benefit   Adverse events/side effects summary: No adverse events/side effects   Patient's therapy is appropriate to: Continue    Goals Addressed             This Visit's Progress    Achieve Undetectable HIV Viral Load < 20       Patient is on track. Patient will maintain adherence         Follow up:  6 months  Bobette Mo Specialty Pharmacist

## 2023-10-13 NOTE — Progress Notes (Signed)
Specialty Pharmacy Refill Coordination Note  Cindy Kirk is a 47 y.o. female contacted today regarding refills of specialty medication(s) Abacavir-Dolutegravir-Lamivud   Patient requested Delivery   Delivery date: 10/19/23   Verified address: [address]   Medication will be filled on 10/18/23.

## 2023-10-23 DIAGNOSIS — Z01419 Encounter for gynecological examination (general) (routine) without abnormal findings: Secondary | ICD-10-CM | POA: Diagnosis not present

## 2023-10-23 DIAGNOSIS — Z1211 Encounter for screening for malignant neoplasm of colon: Secondary | ICD-10-CM | POA: Diagnosis not present

## 2023-10-23 DIAGNOSIS — R102 Pelvic and perineal pain: Secondary | ICD-10-CM | POA: Diagnosis not present

## 2023-10-23 DIAGNOSIS — Z113 Encounter for screening for infections with a predominantly sexual mode of transmission: Secondary | ICD-10-CM | POA: Diagnosis not present

## 2023-10-25 DIAGNOSIS — R102 Pelvic and perineal pain: Secondary | ICD-10-CM | POA: Diagnosis not present

## 2023-10-25 DIAGNOSIS — D259 Leiomyoma of uterus, unspecified: Secondary | ICD-10-CM | POA: Diagnosis not present

## 2023-11-03 DIAGNOSIS — M542 Cervicalgia: Secondary | ICD-10-CM | POA: Diagnosis not present

## 2023-11-15 ENCOUNTER — Other Ambulatory Visit: Payer: Self-pay

## 2023-11-17 ENCOUNTER — Other Ambulatory Visit: Payer: Self-pay

## 2023-11-20 ENCOUNTER — Other Ambulatory Visit: Payer: Self-pay

## 2023-11-20 NOTE — Progress Notes (Signed)
Specialty Pharmacy Refill Coordination Note  Cindy Kirk is a 47 y.o. female contacted today regarding refills of specialty medication(s) Abacavir-Dolutegravir-Lamivud   Patient requested Delivery   Delivery date: 11/24/23   Verified address: [address]   Medication will be filled on 11/22/23.

## 2023-11-21 ENCOUNTER — Telehealth: Payer: Self-pay | Admitting: Pharmacist

## 2023-11-21 NOTE — Telephone Encounter (Signed)
Called patient to discuss HIV medication, Triumeq, for [employer]. No answer, left HIPAA compliant VM.  Margarite Gouge, PharmD, CPP, BCIDP, AAHIVP Clinical Pharmacist Practitioner Infectious Diseases Clinical Pharmacist [facility] for Infectious Disease

## 2023-11-29 ENCOUNTER — Ambulatory Visit (INDEPENDENT_AMBULATORY_CARE_PROVIDER_SITE_OTHER): Payer: Commercial Managed Care - PPO | Admitting: Pharmacist

## 2023-11-29 ENCOUNTER — Other Ambulatory Visit: Payer: Self-pay

## 2023-11-29 DIAGNOSIS — B20 Human immunodeficiency virus [HIV] disease: Secondary | ICD-10-CM

## 2023-11-29 NOTE — Progress Notes (Signed)
Virtual Visit via Telephone Note  I connected with  Cindy Kirk on 11/29/23 at  2:30 PM EST by telephone and verified that I am speaking with the correct person using two identifiers.  Location: Patient: Work  Provider: Clinic   I discussed the limitations, risks, security and privacy concerns of performing an evaluation and management service by telephone and the availability of in person appointments. I also discussed with the patient that there may be a patient responsible charge related to this service. The patient expressed understanding and agreed to proceed.  Date:  11/29/2023   HPI: Cindy Kirk is a 47 y.o. female who presents for follow-up of their specialty HIV medication, Triumeq.  Patient Active Problem List   Diagnosis Date Noted   Neuropathic pain 02/01/2017   Obesity 08/13/2015   Human immunodeficiency virus (HIV) disease (HCC) 07/30/2015   Essential (primary) hypertension 09/19/2012   Vitamin D deficiency 09/19/2012    Patient's Medications  New Prescriptions   No medications on file  Previous Medications   ABACAVIR-DOLUTEGRAVIR-LAMIVUDINE (TRIUMEQ) 600-50-300 MG TABLET    TAKE 1 TABLET BY MOUTH DAILY.   ACETAMINOPHEN-CODEINE (TYLENOL #3) 300-30 MG TABLET    Take 1-2 tablets by mouth every 6 (six) to 8 (eight) hours as needed for pain   CICLOPIROX (PENLAC) 8 % SOLUTION    Apply topically at bedtime. Apply daily on nail and surrounding skin over previous coat. After 7 days remove with alcohol and repeat cycle.   DILTIAZEM (CARTIA XT) 240 MG 24 HR CAPSULE    Take 1 capsule (240 mg total) by mouth daily for blood pressure.   HYDROCHLOROTHIAZIDE (HYDRODIURIL) 12.5 MG TABLET    Take 1 tablet (12.5 mg total) by mouth daily.   HYDROCODONE-ACETAMINOPHEN (NORCO) 5-325 MG TABLET    Take 1 tablet by mouth every 4 (four) hours as needed for moderate pain.   LOSARTAN (COZAAR) 25 MG TABLET    Take 2 tablets (50 mg total) by mouth daily.   LOSARTAN-HYDROCHLOROTHIAZIDE  (HYZAAR) 100-12.5 MG TABLET    Take 1 tablet by mouth daily.   METHOCARBAMOL (ROBAXIN-750) 750 MG TABLET    Take 1 tablet (750 mg total) by mouth every 6 (six) hours as needed for muscle spasms.   ONDANSETRON (ZOFRAN) 4 MG TABLET    Take 1 tablet (4 mg total) by mouth every 8 (eight) hours as needed for Nausea   ROSUVASTATIN (CRESTOR) 10 MG TABLET    Take 1 tablet (10 mg total) by mouth daily.   SEMAGLUTIDE-WEIGHT MANAGEMENT (WEGOVY) 0.25 MG/0.5ML SOAJ    Inject 0.5 mLs (0.25 mg dose) into the skin once a week.   SEMAGLUTIDE-WEIGHT MANAGEMENT (WEGOVY) 0.5 MG/0.5ML SOAJ    Inject 0.5 mg into the skin once a week.   SEMAGLUTIDE-WEIGHT MANAGEMENT (WEGOVY) 1 MG/0.5ML SOAJ    Inject 1 mg into the skin once a week.   SEMAGLUTIDE-WEIGHT MANAGEMENT (WEGOVY) 1.7 MG/0.75ML SOAJ    Take 1.7 mg by mouth once a week.   TERBINAFINE (LAMISIL) 250 MG TABLET    Take 1 tablet (250 mg total) by mouth daily.  Modified Medications   No medications on file  Discontinued Medications   No medications on file    Allergies: No Known Allergies  Past Medical History: Past Medical History:  Diagnosis Date   Arthritis    knee's   HIV (human immunodeficiency virus infection) (HCC) 2003   Hypertension 2003   Umbilical hernia 2003    Social History: Social History   Socioeconomic History  Marital status: Single    Spouse name: Not on file   Number of children: 1   Years of education: Not on file   Highest education level: Not on file  Occupational History   Not on file  Tobacco Use   Smoking status: Never   Smokeless tobacco: Never  Vaping Use   Vaping status: Never Used  Substance and Sexual Activity   Alcohol use: Not Currently   Drug use: Never   Sexual activity: Not on file  Other Topics Concern   Not on file  Social History Narrative   Not on file   Social Determinants of Health   Financial Resource Strain: Low Risk  (10/22/2023)   Received from [facility]   Overall Financial  Resource Strain (CARDIA)    Difficulty of Paying Living Expenses: Not hard at all  Food Insecurity: No Food Insecurity (10/22/2023)   Received from [facility]   Hunger Vital Sign    Worried About Running Out of Food in the Last Year: Never true    Ran Out of Food in the Last Year: Never true  Transportation Needs: No Transportation Needs (10/22/2023)   Received from [facility] - Transportation    Lack of Transportation (Medical): No    Lack of Transportation (Non-Medical): No  Physical Activity: Insufficiently Active (10/22/2023)   Received from [facility] [place]   Exercise Vital Sign    Days of Exercise per Week: 3 days    Minutes of Exercise per Session: 30 min  Stress: No Stress Concern Present (10/22/2023)   Received from [facility] of Occupational Health - Occupational Stress Questionnaire    Feeling of Stress : Not at all  Social Connections: Socially Integrated (10/22/2023)   Received from [facility]   Social Network    How would you rate your social network (family, work, friends)?: Good participation with social networks        No data to display          Labs:  SCr: Lab Results  Component Value Date   CREATININE 0.81 09/13/2023   CREATININE 0.84 07/15/2013   HIV No results found for: "HIV" Hepatitis B No results found for: "HEPBSAB", "HEPBSAG", "HEPBCAB" Hepatitis C No results found for: "HEPCAB", "HCVRNAPCRQN" Hepatitis A No results found for: "HAV" RPR and STI No results found for: "LABRPR", "RPRTITER"      No data to display          Assessment: I spoke with Cindy Kirk today regarding their specialty medication, Triumeq for HIV treatment. Patient takes it every day without any issues or missed doses. No problems with adverse effects or tolerability. No problems getting it from [facility] Updated/reviewed medication list - no drug interactions. All questions answered. Will follow up in 1  year.  Plan: - Continue Triumeq - Follow up in 1 year  I discussed the assessment and treatment plan with the patient. The patient was provided an opportunity to ask questions and all were answered. The patient agreed with the plan and demonstrated an understanding of the instructions.   The patient was advised to call back or seek an in-person evaluation if the symptoms worsen or if the condition fails to improve as anticipated.  I provided 5-10 minutes of non-face-to-face time during this encounter.  Margarite Gouge, PharmD, CPP, BCIDP, AAHIVP Clinical Pharmacist Practitioner Infectious Diseases Clinical Pharmacist [facility] for Infect[facility]

## 2023-12-12 ENCOUNTER — Other Ambulatory Visit: Payer: Self-pay

## 2023-12-15 ENCOUNTER — Other Ambulatory Visit: Payer: Self-pay

## 2023-12-18 ENCOUNTER — Other Ambulatory Visit: Payer: Self-pay

## 2023-12-18 NOTE — Progress Notes (Signed)
Specialty Pharmacy Refill Coordination Note  Cindy Kirk is a 47 y.o. female contacted today regarding refills of specialty medication(s) Abacavir-Dolutegravir-Lamivud (Triumeq)   Patient requested Delivery   Delivery date: 12/19/23   Verified address: [address]   [name] [address]   Medication will be filled on 12/18/23.

## 2024-01-04 ENCOUNTER — Other Ambulatory Visit: Payer: Self-pay

## 2024-01-04 NOTE — Progress Notes (Signed)
 Specialty Pharmacy Refill Coordination Note  Cindy Kirk is a 48 y.o. female contacted today regarding refills of specialty medication(s) Abacavir -Dolutegravir -Lamivud (Triumeq )   Patient requested Delivery   Delivery date: 01/15/24   Verified address: [address]   [name] [id number]  Medication will be filled on 01/12/24

## 2024-01-08 ENCOUNTER — Other Ambulatory Visit (HOSPITAL_BASED_OUTPATIENT_CLINIC_OR_DEPARTMENT_OTHER): Payer: Self-pay

## 2024-01-08 DIAGNOSIS — M542 Cervicalgia: Secondary | ICD-10-CM | POA: Diagnosis not present

## 2024-01-08 MED ORDER — CYCLOBENZAPRINE HCL 5 MG PO TABS
5.0000 mg | ORAL_TABLET | Freq: Every day | ORAL | 1 refills | Status: AC
  Filled 2024-01-08: qty 60, 60d supply, fill #0
  Filled 2024-07-17: qty 60, 60d supply, fill #1

## 2024-01-12 ENCOUNTER — Other Ambulatory Visit: Payer: Self-pay

## 2024-01-18 ENCOUNTER — Other Ambulatory Visit (HOSPITAL_BASED_OUTPATIENT_CLINIC_OR_DEPARTMENT_OTHER): Payer: Self-pay

## 2024-01-19 ENCOUNTER — Other Ambulatory Visit (HOSPITAL_BASED_OUTPATIENT_CLINIC_OR_DEPARTMENT_OTHER): Payer: Self-pay

## 2024-01-19 MED ORDER — LOSARTAN POTASSIUM-HCTZ 100-12.5 MG PO TABS
1.0000 | ORAL_TABLET | Freq: Every day | ORAL | 1 refills | Status: DC
  Filled 2024-01-19: qty 90, 90d supply, fill #0
  Filled 2024-04-17: qty 90, 90d supply, fill #1

## 2024-01-22 ENCOUNTER — Other Ambulatory Visit (HOSPITAL_BASED_OUTPATIENT_CLINIC_OR_DEPARTMENT_OTHER): Payer: Self-pay

## 2024-02-06 ENCOUNTER — Other Ambulatory Visit (HOSPITAL_COMMUNITY): Payer: Self-pay

## 2024-02-06 ENCOUNTER — Other Ambulatory Visit: Payer: Self-pay

## 2024-02-06 NOTE — Progress Notes (Signed)
Specialty Pharmacy Refill Coordination Note  Cindy Kirk is a 48 y.o. female contacted today regarding refills of specialty medication(s) Abacavir-Dolutegravir-Lamivud [name])   Patient requested Delivery   Delivery date: 02/08/24   Verified address: [address]   [name] [address]   Medication will be filled on 02/07/24.

## 2024-02-22 ENCOUNTER — Other Ambulatory Visit (HOSPITAL_BASED_OUTPATIENT_CLINIC_OR_DEPARTMENT_OTHER): Payer: Self-pay

## 2024-02-27 ENCOUNTER — Other Ambulatory Visit (HOSPITAL_COMMUNITY): Payer: Self-pay

## 2024-03-04 ENCOUNTER — Other Ambulatory Visit: Payer: Self-pay

## 2024-03-11 DIAGNOSIS — Z7185 Encounter for immunization safety counseling: Secondary | ICD-10-CM | POA: Diagnosis not present

## 2024-03-11 DIAGNOSIS — B20 Human immunodeficiency virus [HIV] disease: Secondary | ICD-10-CM | POA: Diagnosis not present

## 2024-03-11 DIAGNOSIS — Z13220 Encounter for screening for lipoid disorders: Secondary | ICD-10-CM | POA: Diagnosis not present

## 2024-03-12 ENCOUNTER — Other Ambulatory Visit (HOSPITAL_BASED_OUTPATIENT_CLINIC_OR_DEPARTMENT_OTHER): Payer: Self-pay

## 2024-03-12 ENCOUNTER — Other Ambulatory Visit: Payer: Self-pay

## 2024-03-12 MED ORDER — DOVATO 50-300 MG PO TABS
1.0000 | ORAL_TABLET | Freq: Every day | ORAL | 5 refills | Status: DC
Start: 2024-03-12 — End: 2024-03-13
  Filled 2024-03-12 (×2): qty 30, 30d supply, fill #0

## 2024-03-13 ENCOUNTER — Other Ambulatory Visit: Payer: Self-pay

## 2024-03-13 ENCOUNTER — Other Ambulatory Visit: Payer: Self-pay | Admitting: Pharmacist

## 2024-03-13 MED ORDER — DOVATO 50-300 MG PO TABS
1.0000 | ORAL_TABLET | Freq: Every day | ORAL | 5 refills | Status: DC
  Filled 2024-03-13 – 2024-03-20 (×2): qty 30, 30d supply, fill #0
  Filled 2024-04-16: qty 30, 30d supply, fill #1
  Filled 2024-05-08: qty 30, 30d supply, fill #2
  Filled 2024-06-11: qty 30, 30d supply, fill #3
  Filled 2024-07-02 – 2024-07-04 (×2): qty 30, 30d supply, fill #4
  Filled 2024-08-01: qty 30, 30d supply, fill #5

## 2024-03-13 NOTE — Progress Notes (Signed)
 Patient changing from Triumeq to Dovato.  Insurance verification completed.   The patient is insured through [facility]   Ran test claim for Dovato. Co-pay is $0.  This test claim was processed through [facility]- copay amounts may vary at other pharmacies due to pharmacy/plan contracts, or as the patient moves through the different stages of their insurance plan.

## 2024-03-19 ENCOUNTER — Other Ambulatory Visit (HOSPITAL_BASED_OUTPATIENT_CLINIC_OR_DEPARTMENT_OTHER): Payer: Self-pay

## 2024-03-19 DIAGNOSIS — I10 Essential (primary) hypertension: Secondary | ICD-10-CM | POA: Diagnosis not present

## 2024-03-19 DIAGNOSIS — Z1331 Encounter for screening for depression: Secondary | ICD-10-CM | POA: Diagnosis not present

## 2024-03-19 DIAGNOSIS — R5383 Other fatigue: Secondary | ICD-10-CM | POA: Diagnosis not present

## 2024-03-19 MED ORDER — LOSARTAN POTASSIUM-HCTZ 100-25 MG PO TABS
1.0000 | ORAL_TABLET | Freq: Every day | ORAL | 1 refills | Status: AC
  Filled 2024-03-19: qty 90, 90d supply, fill #0
  Filled 2024-06-17: qty 90, 90d supply, fill #1

## 2024-03-20 ENCOUNTER — Other Ambulatory Visit: Payer: Self-pay | Admitting: Pharmacist

## 2024-03-20 ENCOUNTER — Other Ambulatory Visit (HOSPITAL_COMMUNITY): Payer: Self-pay

## 2024-03-20 ENCOUNTER — Other Ambulatory Visit: Payer: Self-pay

## 2024-03-20 NOTE — Progress Notes (Signed)
 Specialty Pharmacy Initial Fill Coordination Note  Cindy Kirk is a 48 y.o. female contacted today regarding initial fill of specialty medication(s) Dolutegravir-lamiVUDine (Dovato)   Patient requested Delivery   Delivery date: 03/21/24   Verified address: [address]   Medication will be filled on 3/26.   Patient is aware of $0 copayment.

## 2024-03-20 NOTE — Progress Notes (Signed)
 Specialty Pharmacy Initiation Note   Cindy Kirk is a 48 y.o. female who will be followed by the specialty pharmacy service for RxSp HIV    Review of administration, indication, effectiveness, safety, potential side effects, storage/disposable, and missed dose instructions occurred today for patient's specialty medication(s) Dolutegravir-lamiVUDine (Dovato)     Patient/Caregiver did not have any additional questions or concerns.   Patient's therapy is appropriate to: Continue    Goals Addressed   None     Jennette Kettle Specialty Pharmacist

## 2024-03-28 ENCOUNTER — Other Ambulatory Visit (HOSPITAL_COMMUNITY): Payer: Self-pay

## 2024-04-16 ENCOUNTER — Other Ambulatory Visit: Payer: Self-pay

## 2024-04-16 ENCOUNTER — Other Ambulatory Visit (HOSPITAL_COMMUNITY): Payer: Self-pay

## 2024-04-16 NOTE — Progress Notes (Signed)
 Specialty Pharmacy Refill Coordination Note  Cindy Kirk is a 48 y.o. female contacted today regarding refills of specialty medication(s) Dolutegravir -lamiVUDine  (Dovato )   Patient requested Delivery   Delivery date: 04/17/24   Verified address: [address]  [name] [address]   Medication will be filled on 04/16/24.

## 2024-04-17 ENCOUNTER — Other Ambulatory Visit (HOSPITAL_BASED_OUTPATIENT_CLINIC_OR_DEPARTMENT_OTHER): Payer: Self-pay

## 2024-05-08 ENCOUNTER — Other Ambulatory Visit: Payer: Self-pay | Admitting: Pharmacy Technician

## 2024-05-08 ENCOUNTER — Other Ambulatory Visit: Payer: Self-pay

## 2024-05-08 NOTE — Progress Notes (Signed)
 Specialty Pharmacy Refill Coordination Note  Uchenna Gourd is a 48 y.o. female contacted today regarding refills of specialty medication(s) Dolutegravir -lamiVUDine  (Dovato )   Patient requested Delivery   Delivery date: 05/14/24   Verified address: [address] [place], [place] [address]   Medication will be filled on 05/13/24.

## 2024-06-04 ENCOUNTER — Other Ambulatory Visit: Payer: Self-pay

## 2024-06-11 ENCOUNTER — Other Ambulatory Visit: Payer: Self-pay

## 2024-06-11 ENCOUNTER — Other Ambulatory Visit: Payer: Self-pay | Admitting: Pharmacy Technician

## 2024-06-11 NOTE — Progress Notes (Signed)
 Specialty Pharmacy Refill Coordination Note  Cindy Kirk is a 48 y.o. female contacted today regarding refills of specialty medication(s) Dolutegravir -lamiVUDine  (Dovato )   Patient requested Delivery   Delivery date: 06/13/24   Verified address: [address]   [name] [address]   Medication will be filled on 06/12/24.

## 2024-06-17 ENCOUNTER — Other Ambulatory Visit (HOSPITAL_BASED_OUTPATIENT_CLINIC_OR_DEPARTMENT_OTHER): Payer: Self-pay

## 2024-06-26 ENCOUNTER — Other Ambulatory Visit: Payer: Self-pay

## 2024-07-02 ENCOUNTER — Other Ambulatory Visit: Payer: Self-pay

## 2024-07-04 ENCOUNTER — Other Ambulatory Visit (HOSPITAL_COMMUNITY): Payer: Self-pay

## 2024-07-04 ENCOUNTER — Other Ambulatory Visit: Payer: Self-pay

## 2024-07-04 NOTE — Progress Notes (Signed)
 Specialty Pharmacy Refill Coordination Note  Cindy Kirk is a 48 y.o. female contacted today regarding refills of specialty medication(s) Dolutegravir -lamiVUDine  (Dovato )   Patient requested Delivery   Delivery date: 07/10/24   Verified address: [address]   [name] [phone]   Medication will be filled on 07/09/24.

## 2024-07-11 ENCOUNTER — Other Ambulatory Visit (HOSPITAL_BASED_OUTPATIENT_CLINIC_OR_DEPARTMENT_OTHER): Payer: Self-pay

## 2024-07-17 ENCOUNTER — Other Ambulatory Visit (HOSPITAL_BASED_OUTPATIENT_CLINIC_OR_DEPARTMENT_OTHER): Payer: Self-pay

## 2024-07-18 ENCOUNTER — Other Ambulatory Visit (HOSPITAL_BASED_OUTPATIENT_CLINIC_OR_DEPARTMENT_OTHER): Payer: Self-pay

## 2024-07-18 MED ORDER — LOSARTAN POTASSIUM-HCTZ 100-12.5 MG PO TABS
1.0000 | ORAL_TABLET | Freq: Every day | ORAL | 0 refills | Status: DC
  Filled 2024-07-18: qty 90, 90d supply, fill #0

## 2024-08-01 ENCOUNTER — Other Ambulatory Visit: Payer: Self-pay

## 2024-08-01 NOTE — Progress Notes (Signed)
 Specialty Pharmacy Refill Coordination Note  Adisa Vigeant is a 48 y.o. female contacted today regarding refills of specialty medication(s) Dolutegravir -lamiVUDine  (Dovato )   Patient requested Delivery   Delivery date: 08/05/24   Verified address: [address]   [name] [address]   Medication will be filled on 08/02/24.

## 2024-08-22 ENCOUNTER — Other Ambulatory Visit: Payer: Self-pay

## 2024-08-23 ENCOUNTER — Other Ambulatory Visit (HOSPITAL_COMMUNITY): Payer: Self-pay

## 2024-08-23 ENCOUNTER — Other Ambulatory Visit: Payer: Self-pay | Admitting: Pharmacist

## 2024-08-23 ENCOUNTER — Other Ambulatory Visit (HOSPITAL_BASED_OUTPATIENT_CLINIC_OR_DEPARTMENT_OTHER): Payer: Self-pay

## 2024-08-23 ENCOUNTER — Other Ambulatory Visit: Payer: Self-pay

## 2024-08-23 MED ORDER — DOVATO 50-300 MG PO TABS
1.0000 | ORAL_TABLET | Freq: Every day | ORAL | 6 refills | Status: DC
  Filled 2024-08-23: qty 30, 30d supply, fill #0

## 2024-08-23 MED ORDER — DOVATO 50-300 MG PO TABS
1.0000 | ORAL_TABLET | Freq: Every day | ORAL | 6 refills | Status: AC
  Filled 2024-08-23 – 2024-09-05 (×4): qty 30, 30d supply, fill #0
  Filled 2024-10-02: qty 30, 30d supply, fill #1
  Filled 2024-10-30: qty 30, 30d supply, fill #2
  Filled 2024-11-27: qty 30, 30d supply, fill #3
  Filled 2024-12-24: qty 30, 30d supply, fill #4
  Filled 2025-01-22: qty 30, 30d supply, fill #5

## 2024-08-30 ENCOUNTER — Other Ambulatory Visit: Payer: Self-pay

## 2024-09-03 ENCOUNTER — Other Ambulatory Visit: Payer: Self-pay

## 2024-09-05 ENCOUNTER — Other Ambulatory Visit (HOSPITAL_COMMUNITY): Payer: Self-pay

## 2024-09-05 ENCOUNTER — Other Ambulatory Visit: Payer: Self-pay

## 2024-09-05 NOTE — Progress Notes (Signed)
 Specialty Pharmacy Refill Coordination Note  Cindy Kirk is a 48 y.o. female contacted today regarding refills of specialty medication(s) Dolutegravir -lamiVUDine  (Dovato )   Patient requested Delivery   Delivery date: 09/10/24   Verified address: [address]   [name] [phone]   Medication will be filled on 09.15.25.

## 2024-09-09 ENCOUNTER — Other Ambulatory Visit: Payer: Self-pay

## 2024-09-10 ENCOUNTER — Other Ambulatory Visit (HOSPITAL_BASED_OUTPATIENT_CLINIC_OR_DEPARTMENT_OTHER): Payer: Self-pay

## 2024-09-10 DIAGNOSIS — M542 Cervicalgia: Secondary | ICD-10-CM | POA: Diagnosis not present

## 2024-09-10 DIAGNOSIS — M25512 Pain in left shoulder: Secondary | ICD-10-CM | POA: Diagnosis not present

## 2024-09-10 MED ORDER — METHYLPREDNISOLONE 4 MG PO TBPK
ORAL_TABLET | ORAL | 0 refills | Status: AC
  Filled 2024-09-10: qty 21, 6d supply, fill #0

## 2024-09-10 MED ORDER — BUPROPION HCL ER (XL) 150 MG PO TB24
150.0000 mg | ORAL_TABLET | Freq: Every morning | ORAL | 2 refills | Status: AC
  Filled 2024-09-10: qty 30, 30d supply, fill #0

## 2024-09-10 MED ORDER — AMLODIPINE BESYLATE 5 MG PO TABS
5.0000 mg | ORAL_TABLET | Freq: Every day | ORAL | 1 refills | Status: AC
  Filled 2024-09-10: qty 30, 30d supply, fill #0

## 2024-09-12 ENCOUNTER — Other Ambulatory Visit: Payer: Self-pay

## 2024-10-02 ENCOUNTER — Other Ambulatory Visit: Payer: Self-pay

## 2024-10-04 ENCOUNTER — Other Ambulatory Visit: Payer: Self-pay

## 2024-10-04 NOTE — Progress Notes (Signed)
 Specialty Pharmacy Refill Coordination Note  Cindy Kirk is a 48 y.o. female contacted today regarding refills of specialty medication(s) Dolutegravir -lamiVUDine  (Dovato )   Patient requested Delivery   Delivery date: 10/08/24   Verified address: [address]   [name] [phone]   Medication will be filled on 10.13.25.

## 2024-10-07 ENCOUNTER — Other Ambulatory Visit: Payer: Self-pay

## 2024-10-10 ENCOUNTER — Other Ambulatory Visit (HOSPITAL_BASED_OUTPATIENT_CLINIC_OR_DEPARTMENT_OTHER): Payer: Self-pay

## 2024-10-10 MED ORDER — DULOXETINE HCL 30 MG PO CPEP
ORAL_CAPSULE | ORAL | 0 refills | Status: AC
  Filled 2024-10-10: qty 60, 37d supply, fill #0
  Filled 2024-10-10: qty 60, 30d supply, fill #0

## 2024-10-10 MED ORDER — LOSARTAN POTASSIUM-HCTZ 100-12.5 MG PO TABS
1.0000 | ORAL_TABLET | Freq: Every day | ORAL | 1 refills | Status: AC
  Filled 2024-10-10: qty 90, 90d supply, fill #0
  Filled 2025-01-22: qty 90, 90d supply, fill #1

## 2024-10-10 MED ORDER — AMLODIPINE BESYLATE 5 MG PO TABS
5.0000 mg | ORAL_TABLET | Freq: Every day | ORAL | 1 refills | Status: AC
  Filled 2024-10-10: qty 90, 90d supply, fill #0
  Filled 2025-01-14: qty 90, 90d supply, fill #1

## 2024-10-12 DIAGNOSIS — R92323 Mammographic fibroglandular density, bilateral breasts: Secondary | ICD-10-CM | POA: Diagnosis not present

## 2024-10-12 DIAGNOSIS — Z1231 Encounter for screening mammogram for malignant neoplasm of breast: Secondary | ICD-10-CM | POA: Diagnosis not present

## 2024-10-14 ENCOUNTER — Other Ambulatory Visit (HOSPITAL_BASED_OUTPATIENT_CLINIC_OR_DEPARTMENT_OTHER): Payer: Self-pay

## 2024-10-14 MED ORDER — DULOXETINE HCL 20 MG PO CPEP
20.0000 mg | ORAL_CAPSULE | Freq: Every day | ORAL | 2 refills | Status: AC
  Filled 2024-10-14: qty 30, 30d supply, fill #0

## 2024-10-30 ENCOUNTER — Other Ambulatory Visit: Payer: Self-pay

## 2024-10-31 ENCOUNTER — Other Ambulatory Visit: Payer: Self-pay

## 2024-10-31 DIAGNOSIS — B20 Human immunodeficiency virus [HIV] disease: Secondary | ICD-10-CM | POA: Diagnosis not present

## 2024-10-31 NOTE — Progress Notes (Signed)
 Specialty Pharmacy Refill Coordination Note  Cindy Kirk is a 48 y.o. female contacted today regarding refills of specialty medication(s) Dolutegravir -lamiVUDine  (Dovato )   Patient requested Delivery   Delivery date: 11/04/24   Verified address: [address]   [name]  [phone]   Medication will be filled on: 11/04/24

## 2024-11-04 ENCOUNTER — Other Ambulatory Visit: Payer: Self-pay

## 2024-11-04 ENCOUNTER — Other Ambulatory Visit (HOSPITAL_BASED_OUTPATIENT_CLINIC_OR_DEPARTMENT_OTHER): Payer: Self-pay

## 2024-11-04 DIAGNOSIS — Z9189 Other specified personal risk factors, not elsewhere classified: Secondary | ICD-10-CM | POA: Diagnosis not present

## 2024-11-04 DIAGNOSIS — M5412 Radiculopathy, cervical region: Secondary | ICD-10-CM | POA: Diagnosis not present

## 2024-11-04 DIAGNOSIS — B20 Human immunodeficiency virus [HIV] disease: Secondary | ICD-10-CM | POA: Diagnosis not present

## 2024-11-04 DIAGNOSIS — Z7185 Encounter for immunization safety counseling: Secondary | ICD-10-CM | POA: Diagnosis not present

## 2024-11-04 MED ORDER — ROSUVASTATIN CALCIUM 10 MG PO TABS
10.0000 mg | ORAL_TABLET | Freq: Every day | ORAL | 3 refills | Status: AC
  Filled 2024-11-04: qty 90, 90d supply, fill #0

## 2024-11-08 ENCOUNTER — Ambulatory Visit (INDEPENDENT_AMBULATORY_CARE_PROVIDER_SITE_OTHER): Admitting: Pharmacist

## 2024-11-08 ENCOUNTER — Other Ambulatory Visit: Payer: Self-pay

## 2024-11-08 DIAGNOSIS — B20 Human immunodeficiency virus [HIV] disease: Secondary | ICD-10-CM

## 2024-11-08 NOTE — Progress Notes (Signed)
 Virtual Visit via Telephone Note  I connected with  Cindy Kirk on 11/08/24 at  9:45 AM EST by telephone and verified that I am speaking with the correct person using two identifiers.  Location: Patient: Work Provider: Clinic   I discussed the limitations, risks, security and privacy concerns of performing an evaluation and management service by telephone and the availability of in person appointments. I also discussed with the patient that there may be a patient responsible charge related to this service. The patient expressed understanding and agreed to proceed.  Date:  11/08/2024   HPI: Cindy Kirk is a 48 y.o. female who presents for follow-up of their specialty HIV medication, Dovato .  Patient Active Problem List   Diagnosis Date Noted   Neuropathic pain 02/01/2017   Obesity 08/13/2015   Human immunodeficiency virus (HIV) disease (HCC) 07/30/2015   Essential (primary) hypertension 09/19/2012   Vitamin D deficiency 09/19/2012    Patient's Medications  New Prescriptions   No medications on file  Previous Medications   ACETAMINOPHEN -CODEINE  (TYLENOL  #3) 300-30 MG TABLET    Take 1-2 tablets by mouth every 6 (six) to 8 (eight) hours as needed for pain   AMLODIPINE  (NORVASC ) 5 MG TABLET    Take one tablet (5 mg dose) by mouth daily.   AMLODIPINE  (NORVASC ) 5 MG TABLET    Take 1 tablet (5 mg total) by mouth daily.   BUPROPION  (WELLBUTRIN  XL) 150 MG 24 HR TABLET    Take 1 tablet (150 mg total) by mouth every morning.   CICLOPIROX  (PENLAC ) 8 % SOLUTION    Apply topically at bedtime. Apply daily on nail and surrounding skin over previous coat. After 7 days remove with alcohol and repeat cycle.   CYCLOBENZAPRINE  (FLEXERIL ) 5 MG TABLET    Take 1 tablet (5 mg total) by mouth at bedtime.   DILTIAZEM  (CARTIA  XT) 240 MG 24 HR CAPSULE    Take 1 capsule (240 mg total) by mouth daily for blood pressure.   DOLUTEGRAVIR -LAMIVUDINE  (DOVATO ) 50-300 MG TABLET    Take 1 tablet by mouth daily.    DULOXETINE (CYMBALTA) 20 MG CAPSULE    Take 1 capsule (20 mg total) by mouth daily.   DULOXETINE (CYMBALTA) 30 MG CAPSULE    Take 1 capsule (30 mg total) by mouth daily for 14 days, THEN 2 capsules (60 mg total) daily.   HYDROCHLOROTHIAZIDE  (HYDRODIURIL ) 12.5 MG TABLET    Take 1 tablet (12.5 mg total) by mouth daily.   HYDROCODONE -ACETAMINOPHEN  (NORCO) 5-325 MG TABLET    Take 1 tablet by mouth every 4 (four) hours as needed for moderate pain.   LOSARTAN  (COZAAR ) 25 MG TABLET    Take 2 tablets (50 mg total) by mouth daily.   LOSARTAN -HYDROCHLOROTHIAZIDE  (HYZAAR ) 100-12.5 MG TABLET    Take 1 tablet by mouth daily.   LOSARTAN -HYDROCHLOROTHIAZIDE  (HYZAAR ) 100-25 MG TABLET    Take one tablet by mouth daily.   METHOCARBAMOL  (ROBAXIN -750) 750 MG TABLET    Take 1 tablet (750 mg total) by mouth every 6 (six) hours as needed for muscle spasms.   METHYLPREDNISOLONE  (MEDROL ) 4 MG TBPK TABLET    Take as directed on pack over 6 days   ONDANSETRON  (ZOFRAN ) 4 MG TABLET    Take 1 tablet (4 mg total) by mouth every 8 (eight) hours as needed for Nausea   ROSUVASTATIN  (CRESTOR ) 10 MG TABLET    Take 1 tablet (10 mg total) by mouth daily.   ROSUVASTATIN  (CRESTOR ) 10 MG TABLET  Take 1 tablet (10 mg total) by mouth daily.   SEMAGLUTIDE -WEIGHT MANAGEMENT (WEGOVY ) 0.25 MG/0.5ML SOAJ    Inject 0.5 mLs (0.25 mg dose) into the skin once a week.   SEMAGLUTIDE -WEIGHT MANAGEMENT (WEGOVY ) 0.5 MG/0.5ML SOAJ    Inject 0.5 mg into the skin once a week.   SEMAGLUTIDE -WEIGHT MANAGEMENT (WEGOVY ) 1 MG/0.5ML SOAJ    Inject 1 mg into the skin once a week.   SEMAGLUTIDE -WEIGHT MANAGEMENT (WEGOVY ) 1.7 MG/0.75ML SOAJ    Take 1.7 mg by mouth once a week.   TERBINAFINE  (LAMISIL ) 250 MG TABLET    Take 1 tablet (250 mg total) by mouth daily.  Modified Medications   No medications on file  Discontinued Medications   No medications on file    Allergies: No Known Allergies  Past Medical History: Past Medical History:  Diagnosis Date    Arthritis    knee's   HIV (human immunodeficiency virus infection) (HCC) 2003   Hypertension 2003   Umbilical hernia 2003    Social History: Social History   Socioeconomic History   Marital status: Single    Spouse name: Not on file   Number of children: 1   Years of education: Not on file   Highest education level: Not on file  Occupational History   Not on file  Tobacco Use   Smoking status: Never   Smokeless tobacco: Never  Vaping Use   Vaping status: Never Used  Substance and Sexual Activity   Alcohol use: Not Currently   Drug use: Never   Sexual activity: Not on file  Other Topics Concern   Not on file  Social History Narrative   Not on file   Social Drivers of Health   Financial Resource Strain: Low Risk  (10/09/2024)   Received from [facility]   Overall Financial Resource Strain (CARDIA)    How hard is it for you to pay for the very basics like food, housing, medical care, and heating?: Not hard at all  Food Insecurity: No Food Insecurity (10/09/2024)   Received from [facility]   Hunger Vital Sign    Within the past 12 months, you worried that your food would run out before you got the money to buy more.: Never true    Within the past 12 months, the food you bought just didn't last and you didn't have money to get more.: Never true  Transportation Needs: No Transportation Needs (10/09/2024)   Received from [facility] - Transportation    In the past 12 months, has lack of transportation kept you from medical appointments or from getting medications?: No    In the past 12 months, has lack of transportation kept you from meetings, work, or from getting things needed for daily living?: No  Physical Activity: Insufficiently Active (10/09/2024)   Received from [facility]   Exercise Vital Sign    On average, how many days per week do you engage in moderate to strenuous exercise (like a brisk walk)?: 3 days    On average, how many minutes do  you engage in exercise at this level?: 30 min  Stress: Stress Concern Present (10/09/2024)   Received from [facility] of Occupational Health - Occupational Stress Questionnaire    Do you feel stress - tense, restless, nervous, or anxious, or unable to sleep at night because your mind is troubled all the time - these days?: To some extent  Social Connections: Socially Integrated (10/09/2024)  Received from [facility]   Social Network    How would you rate your social network (family, work, friends)?: Good participation with social networks        No data to display          Labs:  SCr: Lab Results  Component Value Date   CREATININE 0.81 09/13/2023   CREATININE 0.84 07/15/2013   HIV No results found for: HIV Hepatitis B No results found for: HEPBSAB, HEPBSAG, HEPBCAB Hepatitis C No results found for: HEPCAB, HCVRNAPCRQN Hepatitis A No results found for: HAV RPR and STI No results found for: LABRPR, RPRTITER      No data to display          Assessment: I spoke with Reather today regarding their specialty medication, Dovato  for HIV treatment. Patient takes it every day without any issues or missed doses. No problems with adverse effects or tolerability. No problems getting it from [facility]. Updated/reviewed medication list - no drug interactions. All questions answered. Will follow up in 1 year.  Plan: - Continue Dovato  - Follow up in 1 year  I discussed the assessment and treatment plan with the patient. The patient was provided an opportunity to ask questions and all were answered. The patient agreed with the plan and demonstrated an understanding of the instructions.   The patient was advised to call back or seek an in-person evaluation if the symptoms worsen or if the condition fails to improve as anticipated.  I provided 15 minutes of non-face-to-face time during this encounter.  Alan Geralds, PharmD, CPP, BCIDP, AAHIVP Clinical Pharmacist Practitioner Infectious Diseases Clinical Pharmacist [facility] for Infectious Disease

## 2024-11-11 ENCOUNTER — Other Ambulatory Visit (HOSPITAL_BASED_OUTPATIENT_CLINIC_OR_DEPARTMENT_OTHER): Payer: Self-pay

## 2024-11-11 MED ORDER — VENLAFAXINE HCL ER 37.5 MG PO CP24
37.5000 mg | ORAL_CAPSULE | Freq: Every day | ORAL | 2 refills | Status: AC
  Filled 2024-11-11: qty 30, 30d supply, fill #0

## 2024-11-12 ENCOUNTER — Other Ambulatory Visit (HOSPITAL_BASED_OUTPATIENT_CLINIC_OR_DEPARTMENT_OTHER): Payer: Self-pay

## 2024-11-18 ENCOUNTER — Emergency Department (HOSPITAL_BASED_OUTPATIENT_CLINIC_OR_DEPARTMENT_OTHER): Admission: EM | Admit: 2024-11-18 | Discharge: 2024-11-18 | Disposition: A | Discharge status: Home / Self Care

## 2024-11-18 ENCOUNTER — Encounter (HOSPITAL_BASED_OUTPATIENT_CLINIC_OR_DEPARTMENT_OTHER): Payer: Self-pay

## 2024-11-18 ENCOUNTER — Other Ambulatory Visit: Payer: Self-pay

## 2024-11-18 ENCOUNTER — Emergency Department (HOSPITAL_BASED_OUTPATIENT_CLINIC_OR_DEPARTMENT_OTHER)

## 2024-11-18 DIAGNOSIS — R079 Chest pain, unspecified: Secondary | ICD-10-CM | POA: Insufficient documentation

## 2024-11-18 DIAGNOSIS — R0789 Other chest pain: Secondary | ICD-10-CM | POA: Diagnosis not present

## 2024-11-18 LAB — CBC
HCT: 43.2 % (ref 36.0–46.0)
Hemoglobin: 15.1 g/dL — ABNORMAL HIGH (ref 12.0–15.0)
MCH: 33.9 pg (ref 26.0–34.0)
MCHC: 35 g/dL (ref 30.0–36.0)
MCV: 96.9 fL (ref 80.0–100.0)
Platelets: 201 K/uL (ref 150–400)
RBC: 4.46 MIL/uL (ref 3.87–5.11)
RDW: 11.9 % (ref 11.5–15.5)
WBC: 5.6 K/uL (ref 4.0–10.5)
nRBC: 0 % (ref 0.0–0.2)

## 2024-11-18 LAB — PREGNANCY, URINE: Preg Test, Ur: NEGATIVE

## 2024-11-18 LAB — BASIC METABOLIC PANEL WITH GFR
Anion gap: 12 (ref 5–15)
BUN: 13 mg/dL (ref 6–20)
CO2: 24 mmol/L (ref 22–32)
Calcium: 9.3 mg/dL (ref 8.9–10.3)
Chloride: 105 mmol/L (ref 98–111)
Creatinine, Ser: 0.78 mg/dL (ref 0.44–1.00)
GFR, Estimated: >60 mL/min (ref >60)
Glucose, Bld: 89 mg/dL (ref 70–99)
Potassium: 3.4 mmol/L — ABNORMAL LOW (ref 3.5–5.1)
Sodium: 141 mmol/L (ref 135–145)

## 2024-11-18 LAB — TROPONIN T, HIGH SENSITIVITY
Troponin T High Sensitivity: <15 ng/L (ref 0–19)
Troponin T High Sensitivity: <15 ng/L (ref 0–19)

## 2024-11-18 MED ORDER — LIDOCAINE 5 % EX PTCH
1.0000 | MEDICATED_PATCH | Freq: Once | CUTANEOUS | Status: DC
  Administered 2024-11-18: 1 via TRANSDERMAL
  Filled 2024-11-18: qty 1

## 2024-11-18 MED ORDER — ALUM & MAG HYDROXIDE-SIMETH 200-200-20 MG/5ML PO SUSP
30.0000 mL | Freq: Once | ORAL | Status: DC
  Filled 2024-11-18: qty 30

## 2024-11-18 MED ORDER — ACETAMINOPHEN 325 MG PO TABS
650.0000 mg | ORAL_TABLET | Freq: Once | ORAL | Status: AC
  Administered 2024-11-18: 650 mg via ORAL
  Filled 2024-11-18: qty 2

## 2024-11-18 NOTE — ED Triage Notes (Signed)
 Pt reports waking up at 6am and felt like heart racing, shaky and Pain under left breast. Denies SOB Chest pain yesterday but thought it was gas. Denies N/V/D

## 2024-11-18 NOTE — ED Provider Notes (Signed)
 [place] [facility] AT [facility] Provider Note   CSN: [id number] Arrival date & time: 11/18/24  9189     Patient presents with: Chest Pain   Cindy Kirk is a 48 y.o. female.   62 Old female presenting emergency department for chest pain.  Reports symptoms for the past 2 days and.  Awoke this morning with pain again, felt her heart was beating abnormally and fast.  No lightheadedness, dizziness, shortness of breath.  No history of cardiac disease.  Pain worse with movement or deep breath..  Low risk for PE based on Wells criteria, PERC negative   Chest Pain      Prior to Admission medications   Medication Sig Start Date End Date Taking? Authorizing Provider  acetaminophen -codeine  (TYLENOL  #3) 300-30 MG tablet Take 1-2 tablets by mouth every 6 (six) to 8 (eight) hours as needed for pain 08/18/23     amLODipine  (NORVASC ) 5 MG tablet Take one tablet (5 mg dose) by mouth daily. 09/10/24     amLODipine  (NORVASC ) 5 MG tablet Take 1 tablet (5 mg total) by mouth daily. 10/10/24     buPROPion  (WELLBUTRIN  XL) 150 MG 24 hr tablet Take 1 tablet (150 mg total) by mouth every morning. 09/10/24     ciclopirox  (PENLAC ) 8 % solution Apply topically at bedtime. Apply daily on nail and surrounding skin over previous coat. After 7 days remove with alcohol and repeat cycle. Patient not taking: Reported on 09/07/2023 03/28/23     cyclobenzaprine  (FLEXERIL ) 5 MG tablet Take 1 tablet (5 mg total) by mouth at bedtime. 01/08/24     diltiazem  (CARTIA  XT) 240 MG 24 hr capsule Take 1 capsule (240 mg total) by mouth daily for blood pressure. 05/26/23     dolutegravir -lamiVUDine  (DOVATO ) 50-300 MG tablet Take 1 tablet by mouth daily. 08/23/24   Waddell Alan PARAS, RPH-CPP  DULoxetine  (CYMBALTA ) 20 MG capsule Take 1 capsule (20 mg total) by mouth daily. 10/14/24     DULoxetine  (CYMBALTA ) 30 MG capsule Take 1 capsule (30 mg total) by mouth daily for 14 days, THEN 2 capsules (60 mg total) daily.  10/10/24 11/16/24    hydrochlorothiazide  (HYDRODIURIL ) 12.5 MG tablet Take 1 tablet (12.5 mg total) by mouth daily. Patient not taking: Reported on 09/07/2023 05/08/23     HYDROcodone -acetaminophen  (NORCO) 5-325 MG tablet Take 1 tablet by mouth every 4 (four) hours as needed for moderate pain. 09/20/23   Beuford Anes, MD  losartan  (COZAAR ) 25 MG tablet Take 2 tablets (50 mg total) by mouth daily. Patient not taking: Reported on 09/07/2023 06/14/23     losartan -hydrochlorothiazide  (HYZAAR ) 100-12.5 MG tablet Take 1 tablet by mouth daily. 10/10/24     losartan -hydrochlorothiazide  (HYZAAR ) 100-25 MG tablet Take one tablet by mouth daily. 03/19/24     methocarbamol  (ROBAXIN -750) 750 MG tablet Take 1 tablet (750 mg total) by mouth every 6 (six) hours as needed for muscle spasms. 09/20/23   Beuford Anes, MD  methylPREDNISolone  (MEDROL ) 4 MG TBPK tablet Take as directed on pack over 6 days 09/10/24     ondansetron  (ZOFRAN ) 4 MG tablet Take 1 tablet (4 mg total) by mouth every 8 (eight) hours as needed for Nausea 05/08/23     rosuvastatin  (CRESTOR ) 10 MG tablet Take 1 tablet (10 mg total) by mouth daily. 09/04/23     rosuvastatin  (CRESTOR ) 10 MG tablet Take 1 tablet (10 mg total) by mouth daily. 11/04/24     Semaglutide -Weight Management (WEGOVY ) 0.25 MG/0.5ML SOAJ Inject 0.5 mLs (  0.25 mg dose) into the skin once a week. Patient not taking: Reported on 09/07/2023 02/22/23     Semaglutide -Weight Management (WEGOVY ) 0.5 MG/0.5ML SOAJ Inject 0.5 mg into the skin once a week. Patient not taking: Reported on 09/07/2023 03/01/23     Semaglutide -Weight Management (WEGOVY ) 1 MG/0.5ML SOAJ Inject 1 mg into the skin once a week. Patient not taking: Reported on 09/07/2023 04/03/23     Semaglutide -Weight Management (WEGOVY ) 1.7 MG/0.75ML SOAJ Take 1.7 mg by mouth once a week. Patient not taking: Reported on 09/07/2023 05/08/23     terbinafine  (LAMISIL ) 250 MG tablet Take 1 tablet (250 mg total) by mouth daily. Patient not taking:  Reported on 09/07/2023 03/28/23     venlafaxine  XR (EFFEXOR -XR) 37.5 MG 24 hr capsule Take 1 capsule (37.5 mg total) by mouth daily. 11/11/24       Allergies: Patient has no known allergies.    Review of Systems  Cardiovascular:  Positive for chest pain.    Updated Vital Signs BP (!) 135/101   Pulse 95   Temp 99 F (37.2 C) (Oral)   Resp 10   Wt 83.9 kg   SpO2 98%   BMI 36.13 kg/m   Physical Exam Vitals and nursing note reviewed.  Constitutional:      Appearance: She is obese.  HENT:     Head: Normocephalic.  Cardiovascular:     Rate and Rhythm: Normal rate and regular rhythm.     Pulses:          Radial pulses are 2+ on the right side and 2+ on the left side.     Heart sounds: Normal heart sounds.  Pulmonary:     Breath sounds: Normal breath sounds.  Abdominal:     Palpations: Abdomen is soft.     Tenderness: There is no abdominal tenderness.  Musculoskeletal:     Right lower leg: No edema.     Left lower leg: No edema.  Skin:    General: Skin is warm and dry.     Capillary Refill: Capillary refill takes less than 2 seconds.  Neurological:     Mental Status: She is alert and oriented to person, place, and time.  Psychiatric:        Mood and Affect: Mood normal.        Behavior: Behavior normal.     (all labs ordered are listed, but only abnormal results are displayed) Labs Reviewed  BASIC METABOLIC PANEL WITH GFR - Abnormal; Notable for the following components:      Result Value   Potassium 3.4 (*)    All other components within normal limits  CBC - Abnormal; Notable for the following components:   Hemoglobin 15.1 (*)    All other components within normal limits  PREGNANCY, URINE  TROPONIN T, HIGH SENSITIVITY  TROPONIN T, HIGH SENSITIVITY    EKG: EKG Interpretation Date/Time:  Monday November 18 2024 08:20:12 EST Ventricular Rate:  93 PR Interval:  170 QRS Duration:  81 QT Interval:  337 QTC Calculation: 420 R Axis:   34  Text  Interpretation: Sinus rhythm Borderline T wave abnormalities Confirmed by Neysa Clap [phone]) on 11/18/2024 11:17:33 AM  Radiology: [facility] Chest 2 View Result Date: 11/18/2024 CLINICAL DATA:  Left-sided chest pain. EXAM: CHEST - 2 VIEW COMPARISON:  Chest radiograph dated 07/15/2013. FINDINGS: The heart size and mediastinal contours are within normal limits. Both lungs are clear. The visualized skeletal structures are unremarkable. IMPRESSION: No active cardiopulmonary disease. Electronically Signed  By: Vanetta Chou M.D.   On: 11/18/2024 09:29     Procedures   Medications Ordered in the ED  lidocaine  (LIDODERM ) 5 % 1 patch (1 patch Transdermal Patch Applied 11/18/24 0940)  alum & mag hydroxide-simeth (MAALOX/MYLANTA) 200-200-20 MG/5ML suspension 30 mL (30 mLs Oral Patient Refused/Not Given 11/18/24 0946)  acetaminophen  (TYLENOL ) tablet 650 mg (650 mg Oral Given 11/18/24 0934)    Clinical Course as of 11/18/24 1205  Mon Nov 18, 2024  0845 ECG on my independent interpretation appears to be sinus rhythm at a rate of 93 bpm.  Normal intervals.  No QTc prolongation.  I do not appreciate ischemic changes. [TY]  1202 Troponin T High Sensitivity: <15 Troponins negative x 2.  ACS unlikely. [TY]  1202 Basic metabolic panel(!) No significant abnormalities.  Normal kidney function. [TY]  1202 CBC(!) No leukocytosis to suggest infectious process. [TY]  1203 Patient's workup reassuring.  Discussed outpatient follow-up with PCP for Holter monitoring given her complaint of palpitations prior to onset of symptoms.  Normal sinus rhythm on the monitor here however.  Discussed return precautions.  Will discharge in stable condition as it does not appear that she has an acute emergent cause for her chest pain. [TY]    Clinical Course User Index [TY] Neysa Caron PARAS, DO                                 Medical Decision Making 48 year old female presenting emergency department for chest pain.  She is  afebrile nontachycardic, normal tensive.  Maintaining oxygen saturation on room air.  Does not appear to be in overt distress.  EKG as noted in ED course without ischemic changes.  No history of cardiac disease.  Does have a past medical history include hypertension, HIV and obesity.  Low suspicion for PE, low risk Wells/PERC negative.  Will get cardiac screening labs.  See ED course for further MDM final disposition  Amount and/or Complexity of Data Reviewed External Data Reviewed:     Details: No prior cardiac testing on file Labs: ordered. Decision-making details documented in ED Course. Radiology: ordered and independent interpretation performed.    Details: Do not appreciate pneumonia pneumothorax.  No vitamin U sign ECG/medicine tests: independent interpretation performed.    Details: Without ischemic changes.  Risk OTC drugs. Prescription drug management. Decision regarding hospitalization. Diagnosis or treatment significantly limited by social determinants of health.        Final diagnoses:  None    ED Discharge Orders     None          Neysa Caron PARAS, DO 11/18/24 1205

## 2024-11-18 NOTE — ED Notes (Signed)
 RN assisted with med admin while I was occupied. Pt complained of the med choice and refused the Maalox.

## 2024-11-18 NOTE — ED Notes (Signed)
 Pt requesting pain medication. EDP messaged and awaiting orders

## 2024-11-18 NOTE — Discharge Instructions (Addendum)
 Please follow-up with your primary doctor.  Also giving you the number to the cardiologist that she may follow-up with as well.  Return for fevers, chills, lightheadedness, passout, palpitations, worsening chest pain, difficulty breathing, uncontrolled nausea vomiting or any new or worsening symptoms that are concerning to you.

## 2024-11-18 NOTE — ED Notes (Signed)

## 2024-11-26 ENCOUNTER — Other Ambulatory Visit (HOSPITAL_BASED_OUTPATIENT_CLINIC_OR_DEPARTMENT_OTHER): Payer: Self-pay

## 2024-11-26 MED ORDER — VENLAFAXINE HCL ER 37.5 MG PO CP24
75.0000 mg | ORAL_CAPSULE | Freq: Every day | ORAL | 2 refills | Status: AC
  Filled 2024-11-26: qty 60, 30d supply, fill #0

## 2024-11-27 ENCOUNTER — Other Ambulatory Visit: Payer: Self-pay

## 2024-11-27 NOTE — Progress Notes (Signed)
 Specialty Pharmacy Refill Coordination Note  Cindy Kirk is a 48 y.o. female contacted today regarding refills of specialty medication(s) Dolutegravir -lamiVUDine  (Dovato )   Patient requested Delivery   Delivery date: 12/04/24   Verified address: [address]   [name] [phone]   Medication will be filled on: 12/03/24

## 2024-11-28 ENCOUNTER — Other Ambulatory Visit: Payer: Self-pay

## 2024-11-28 NOTE — Progress Notes (Signed)
 Clinical Intervention Note  Clinical Intervention Notes: Patient reported starting on Venlafaxine , no DDIs identifed with her Dovato .   Clinical Intervention Outcomes: Prevention of an adverse drug event   Cindy Kirk Blair Karel Santa

## 2024-12-03 ENCOUNTER — Other Ambulatory Visit: Payer: Self-pay

## 2024-12-10 ENCOUNTER — Other Ambulatory Visit (HOSPITAL_BASED_OUTPATIENT_CLINIC_OR_DEPARTMENT_OTHER): Payer: Self-pay

## 2024-12-10 MED ORDER — MELOXICAM 7.5 MG PO TABS
7.5000 mg | ORAL_TABLET | Freq: Every day | ORAL | 2 refills | Status: AC
  Filled 2024-12-10: qty 30, 30d supply, fill #0

## 2024-12-17 ENCOUNTER — Other Ambulatory Visit: Payer: Self-pay

## 2024-12-24 ENCOUNTER — Other Ambulatory Visit: Payer: Self-pay

## 2024-12-27 ENCOUNTER — Other Ambulatory Visit: Payer: Self-pay

## 2024-12-27 NOTE — Progress Notes (Signed)
 Specialty Pharmacy Refill Coordination Note  Cindy Kirk is a 49 y.o. female contacted today regarding refills of specialty medication(s) Dolutegravir -lamiVUDine  (Dovato )   Patient requested Delivery   Delivery date: 12/31/24   Verified address: [address]   [name] [phone]   Medication will be filled on: 12/30/24

## 2024-12-30 ENCOUNTER — Other Ambulatory Visit: Payer: Self-pay

## 2025-01-14 ENCOUNTER — Other Ambulatory Visit (HOSPITAL_BASED_OUTPATIENT_CLINIC_OR_DEPARTMENT_OTHER): Payer: Self-pay

## 2025-01-22 ENCOUNTER — Other Ambulatory Visit (HOSPITAL_BASED_OUTPATIENT_CLINIC_OR_DEPARTMENT_OTHER): Payer: Self-pay

## 2025-01-22 ENCOUNTER — Other Ambulatory Visit: Payer: Self-pay

## 2025-01-22 NOTE — Progress Notes (Signed)
 Specialty Pharmacy Refill Coordination Note  Cindy Kirk is a 49 y.o. female contacted today regarding refills of specialty medication(s) Dolutegravir -lamiVUDine  (Dovato )   Patient requested Delivery   Delivery date: 01/30/25   Verified address: [address]   [name] [phone]   Medication will be filled on: 01/29/25

## 2025-01-29 ENCOUNTER — Other Ambulatory Visit: Payer: Self-pay
